# Patient Record
Sex: Female | Born: 1935 | Race: White | Hispanic: No | Smoking: Never smoker
Health system: Southern US, Community
[De-identification: ages and names within clinical notes are randomized; demographics above are authoritative.]

## PROBLEM LIST (undated history)

## (undated) DIAGNOSIS — R262 Difficulty in walking, not elsewhere classified: Secondary | ICD-10-CM

## (undated) DIAGNOSIS — G5600 Carpal tunnel syndrome, unspecified upper limb: Secondary | ICD-10-CM

## (undated) DIAGNOSIS — D649 Anemia, unspecified: Secondary | ICD-10-CM

## (undated) DIAGNOSIS — H9319 Tinnitus, unspecified ear: Secondary | ICD-10-CM

## (undated) DIAGNOSIS — R7989 Other specified abnormal findings of blood chemistry: Secondary | ICD-10-CM

## (undated) DIAGNOSIS — R269 Unspecified abnormalities of gait and mobility: Secondary | ICD-10-CM

## (undated) DIAGNOSIS — N1832 Chronic kidney disease, stage 3b: Secondary | ICD-10-CM

## (undated) DIAGNOSIS — I10 Essential (primary) hypertension: Secondary | ICD-10-CM

## (undated) HISTORY — DX: Difficulty in walking, not elsewhere classified: R26.2

## (undated) HISTORY — DX: Chronic kidney disease, stage 3b: N18.32

## (undated) HISTORY — PX: TOTAL ABDOMINAL HYSTERECTOMY W/ BILATERAL SALPINGOOPHORECTOMY: SHX83

## (undated) HISTORY — PX: APPENDECTOMY: SHX54

## (undated) HISTORY — PX: CORNEAL TRANSPLANT: SHX108

## (undated) HISTORY — DX: Other specified abnormal findings of blood chemistry: R79.89

## (undated) HISTORY — DX: Unspecified abnormalities of gait and mobility: R26.9

## (undated) HISTORY — DX: Carpal tunnel syndrome, unspecified upper limb: G56.00

## (undated) HISTORY — DX: Anemia, unspecified: D64.9

## (undated) HISTORY — PX: TUBAL LIGATION: SHX77

## (undated) HISTORY — PX: BACK SURGERY: SHX140

## (undated) HISTORY — PX: CATARACT EXTRACTION, BILATERAL: SHX1313

## (undated) HISTORY — DX: Tinnitus, unspecified ear: H93.19

---

## 2002-01-07 ENCOUNTER — Ambulatory Visit (HOSPITAL_COMMUNITY): Admission: RE | Admit: 2002-01-07 | Discharge: 2002-01-07 | Payer: Self-pay | Admitting: Ophthalmology

## 2008-08-18 ENCOUNTER — Ambulatory Visit: Payer: Self-pay | Admitting: Family Medicine

## 2009-09-21 ENCOUNTER — Ambulatory Visit: Payer: Self-pay | Admitting: Family Medicine

## 2010-05-22 ENCOUNTER — Ambulatory Visit: Payer: Self-pay | Admitting: Otolaryngology

## 2010-12-04 ENCOUNTER — Ambulatory Visit: Payer: Self-pay | Admitting: Family Medicine

## 2011-12-18 ENCOUNTER — Ambulatory Visit: Payer: Self-pay | Admitting: Family Medicine

## 2011-12-24 ENCOUNTER — Ambulatory Visit: Payer: Self-pay | Admitting: Gastroenterology

## 2016-10-29 DIAGNOSIS — I10 Essential (primary) hypertension: Secondary | ICD-10-CM | POA: Insufficient documentation

## 2020-04-11 ENCOUNTER — Other Ambulatory Visit: Payer: Self-pay | Admitting: Unknown Physician Specialty

## 2020-04-11 DIAGNOSIS — H9312 Tinnitus, left ear: Secondary | ICD-10-CM

## 2020-04-13 ENCOUNTER — Other Ambulatory Visit: Payer: Self-pay

## 2020-04-13 ENCOUNTER — Ambulatory Visit: Payer: Medicare PPO | Admitting: Nurse Practitioner

## 2020-04-13 VITALS — BP 150/90 | HR 66 | Temp 97.5°F | Ht 61.0 in | Wt 128.0 lb

## 2020-04-13 DIAGNOSIS — I10 Essential (primary) hypertension: Secondary | ICD-10-CM | POA: Diagnosis not present

## 2020-04-13 NOTE — Patient Instructions (Signed)
To call Janci ([place] NURSE) on Monday 5/24 and schedule a blood pressure check- make sure to bring your blood pressure cuff in with you   [facility] [place] NURSE phone number is [phone]- please call and make an appointment

## 2020-04-13 NOTE — Progress Notes (Signed)
Careteam: Patient Care Team: Dorothey Baseman, MD as PCP - General (Family Medicine)  Advanced Directive information    No Known Allergies  Chief Complaint  Patient presents with  . Acute Visit    Blood Pressure check     HPI: Patient is a 86 y.o. female seen in today at the [facility] for blood pressure check. Pt with hx of HTN and reports she checks her blood pressure at home but did not trust her readings.  Took medication around 5 am this morning Generally blood pressure at home is less than 140 but can not recall exact readings.  No headaches, blurred vision, shortness of breath, dizziness, LE edema Reports overall she feels well.   Review of Systems:  Review of Systems  Constitutional: Negative for chills, fever and weight loss.  HENT: Negative for tinnitus.   Respiratory: Negative for cough, sputum production and shortness of breath.   Cardiovascular: Negative for chest pain, palpitations and leg swelling.  Gastrointestinal: Negative for abdominal pain, constipation, diarrhea and heartburn.  Skin: Negative.   Neurological: Negative for dizziness and headaches.  Psychiatric/Behavioral: Negative for depression and memory loss. The patient does not have insomnia.     No past medical history on file.  Social History:   has no history on file for tobacco, alcohol, and drug.  No family history on file.  Medications: Patient's Medications  New Prescriptions   No medications on file  Previous Medications   LISINOPRIL-HYDROCHLOROTHIAZIDE (ZESTORETIC) 20-12.5 MG TABLET    Take by mouth.   METOPROLOL SUCCINATE (TOPROL-XL) 25 MG 24 HR TABLET    TAKE 1 TABLET(25 MG) BY MOUTH EVERY DAY  Modified Medications   No medications on file  Discontinued Medications   No medications on file    Physical Exam:  Vitals:   04/13/20 0917  BP: (!) 150/90  Pulse: 66  Temp: (!) 97.5 F (36.4 C)  TempSrc: Temporal  SpO2: 98%  Weight: 128 lb (58.1 kg)  Height: 5'  1" (1.549 m)   Body mass index is 24.19 kg/m. Wt Readings from Last 3 Encounters:  04/13/20 128 lb (58.1 kg)    Physical Exam Constitutional:      General: She is not in acute distress.    Appearance: She is well-developed.  HENT:     Head: Normocephalic and atraumatic.  Eyes:     Conjunctiva/sclera: Conjunctivae normal.     Pupils: Pupils are equal, round, and reactive to light.  Cardiovascular:     Rate and Rhythm: Normal rate and regular rhythm.     Heart sounds: Normal heart sounds.  Pulmonary:     Effort: Pulmonary effort is normal.     Breath sounds: Normal breath sounds.  Skin:    General: Skin is warm and dry.  Neurological:     Mental Status: She is alert and oriented to person, place, and time.  Psychiatric:        Behavior: Behavior normal.     Labs reviewed: Basic Metabolic Panel: No results for input(s): NA, K, CL, CO2, GLUCOSE, BUN, CREATININE, CALCIUM, MG, PHOS, TSH in the last 8760 hours. Liver Function Tests: No results for input(s): AST, ALT, ALKPHOS, BILITOT, PROT, ALBUMIN in the last 8760 hours. No results for input(s): LIPASE, AMYLASE in the last 8760 hours. No results for input(s): AMMONIA in the last 8760 hours. CBC: No results for input(s): WBC, NEUTROABS, HGB, HCT, MCV, PLT in the last 8760 hours. Lipid Panel: No results for input(s): CHOL,  HDL, LDLCALC, TRIG, CHOLHDL, LDLDIRECT in the last 8760 hours. TSH: No results for input(s): TSH in the last 8760 hours. A1C: No results found for: HGBA1C   Assessment/Plan 1. Essential hypertension Blood pressure confirmed on recheck. She states the are generally better at home. Will have her schedule appt with [place] nurse for recheck and to bring her home bp cuff in to correlate. Continues on lisinopril-hctz with metoprolol succinate 25 mg daily. Low sodium diet encouraged.    Carlos American. [place], [facility] [phone]

## 2020-04-26 ENCOUNTER — Other Ambulatory Visit: Payer: Self-pay

## 2020-04-26 ENCOUNTER — Ambulatory Visit
Admission: RE | Admit: 2020-04-26 | Discharge: 2020-04-26 | Disposition: A | Discharge status: Home / Self Care | Payer: Medicare PPO | Source: Ambulatory Visit | Attending: Unknown Physician Specialty | Admitting: Unknown Physician Specialty

## 2020-04-26 DIAGNOSIS — H9312 Tinnitus, left ear: Secondary | ICD-10-CM | POA: Diagnosis present

## 2020-04-26 MED ORDER — GADOBUTROL 1 MMOL/ML IV SOLN
5.0000 mL | Freq: Once | INTRAVENOUS | Status: AC | PRN
  Administered 2020-04-26: 5 mL via INTRAVENOUS

## 2020-08-23 DIAGNOSIS — R29898 Other symptoms and signs involving the musculoskeletal system: Secondary | ICD-10-CM | POA: Insufficient documentation

## 2020-08-23 DIAGNOSIS — R440 Auditory hallucinations: Secondary | ICD-10-CM | POA: Insufficient documentation

## 2020-12-04 DIAGNOSIS — R2689 Other abnormalities of gait and mobility: Secondary | ICD-10-CM | POA: Insufficient documentation

## 2020-12-04 DIAGNOSIS — R262 Difficulty in walking, not elsewhere classified: Secondary | ICD-10-CM | POA: Insufficient documentation

## 2021-04-16 ENCOUNTER — Encounter: Payer: Self-pay | Admitting: Emergency Medicine

## 2021-04-16 ENCOUNTER — Emergency Department: Payer: Medicare PPO

## 2021-04-16 ENCOUNTER — Emergency Department
Admission: EM | Admit: 2021-04-16 | Discharge: 2021-04-16 | Disposition: A | Discharge status: Home / Self Care | Payer: Medicare PPO | Attending: Emergency Medicine | Admitting: Emergency Medicine

## 2021-04-16 ENCOUNTER — Other Ambulatory Visit: Payer: Self-pay

## 2021-04-16 DIAGNOSIS — I10 Essential (primary) hypertension: Secondary | ICD-10-CM | POA: Diagnosis not present

## 2021-04-16 DIAGNOSIS — R110 Nausea: Secondary | ICD-10-CM | POA: Insufficient documentation

## 2021-04-16 DIAGNOSIS — Z79899 Other long term (current) drug therapy: Secondary | ICD-10-CM | POA: Diagnosis not present

## 2021-04-16 DIAGNOSIS — H8110 Benign paroxysmal vertigo, unspecified ear: Secondary | ICD-10-CM

## 2021-04-16 DIAGNOSIS — R42 Dizziness and giddiness: Secondary | ICD-10-CM | POA: Diagnosis present

## 2021-04-16 HISTORY — DX: Essential (primary) hypertension: I10

## 2021-04-16 LAB — COMPREHENSIVE METABOLIC PANEL
ALT: 33 U/L (ref 0–44)
AST: 36 U/L (ref 15–41)
Albumin: 4.2 g/dL (ref 3.5–5.0)
Alkaline Phosphatase: 57 U/L (ref 38–126)
Anion gap: 12 (ref 5–15)
BUN: 38 mg/dL — ABNORMAL HIGH (ref 8–23)
CO2: 26 mmol/L (ref 22–32)
Calcium: 9.3 mg/dL (ref 8.9–10.3)
Chloride: 98 mmol/L (ref 98–111)
Creatinine, Ser: 1.17 mg/dL — ABNORMAL HIGH (ref 0.44–1.00)
GFR, Estimated: 45 mL/min — ABNORMAL LOW (ref >60)
Glucose, Bld: 102 mg/dL — ABNORMAL HIGH (ref 70–99)
Potassium: 3.8 mmol/L (ref 3.5–5.1)
Sodium: 136 mmol/L (ref 135–145)
Total Bilirubin: 0.9 mg/dL (ref 0.3–1.2)
Total Protein: 7 g/dL (ref 6.5–8.1)

## 2021-04-16 LAB — PROTIME-INR
INR: 0.9 (ref 0.8–1.2)
Prothrombin Time: 12.6 seconds (ref 11.4–15.2)

## 2021-04-16 LAB — DIFFERENTIAL
Abs Immature Granulocytes: 0.02 10*3/uL (ref 0.00–0.07)
Basophils Absolute: 0 10*3/uL (ref 0.0–0.1)
Basophils Relative: 1 %
Eosinophils Absolute: 0.2 10*3/uL (ref 0.0–0.5)
Eosinophils Relative: 4 %
Immature Granulocytes: 0 %
Lymphocytes Relative: 24 %
Lymphs Abs: 1.2 10*3/uL (ref 0.7–4.0)
Monocytes Absolute: 0.6 10*3/uL (ref 0.1–1.0)
Monocytes Relative: 11 %
Neutro Abs: 3.1 10*3/uL (ref 1.7–7.7)
Neutrophils Relative %: 60 %

## 2021-04-16 LAB — APTT: aPTT: 27 seconds (ref 24–36)

## 2021-04-16 LAB — CBC
HCT: 35.6 % — ABNORMAL LOW (ref 36.0–46.0)
Hemoglobin: 12.5 g/dL (ref 12.0–15.0)
MCH: 34.6 pg — ABNORMAL HIGH (ref 26.0–34.0)
MCHC: 35.1 g/dL (ref 30.0–36.0)
MCV: 98.6 fL (ref 80.0–100.0)
Platelets: 238 10*3/uL (ref 150–400)
RBC: 3.61 MIL/uL — ABNORMAL LOW (ref 3.87–5.11)
RDW: 12.3 % (ref 11.5–15.5)
WBC: 5.1 10*3/uL (ref 4.0–10.5)
nRBC: 0 % (ref 0.0–0.2)

## 2021-04-16 MED ORDER — MECLIZINE HCL 25 MG PO TABS
25.0000 mg | ORAL_TABLET | Freq: Once | ORAL | Status: AC
  Administered 2021-04-16: 25 mg via ORAL
  Filled 2021-04-16: qty 1

## 2021-04-16 MED ORDER — MECLIZINE HCL 25 MG PO TABS: 25.0000 mg | ORAL_TABLET | Freq: Three times a day (TID) | ORAL | 0 refills | Status: DC | PRN

## 2021-04-16 NOTE — ED Triage Notes (Signed)
To ED via POV with c/o severe dizziness, pt states is unable to stand due to dizziness. Pt states hx of balance issues at baseline. Pt states LKW 5/22 at 1030pm, awoke with symptoms this morning at 0530. Pt denies further symptoms, A&O x4 in triage.

## 2021-04-16 NOTE — ED Provider Notes (Signed)
[facility] Emergency Department Provider Note   ____________________________________________   Event Date/Time   First MD Initiated Contact with Patient 04/16/21 1502     (approximate)  I have reviewed the triage vital signs and the nursing notes.   HISTORY  Chief Complaint Dizziness    HPI Brittany Bryant is a 87 y.o. female with the below stated past medical history who presents for vertigo that began at 0530 this morning with last known well time yesterday at 2230.  Patient states that she noticed when she tried to get up from bed that she had the sensation of the "room spinning around me" with associated nausea without vomiting.  Patient denies any history of similar symptoms in the past.  Patient states that the symptoms have improved throughout the day.  Patient denies any other exacerbating or relieving factors.  Patient currently denies any vision changes, tinnitus, difficulty speaking, facial droop, sore throat, chest pain, shortness of breath, abdominal pain, nausea/vomiting/diarrhea, dysuria, or weakness/numbness/paresthesias in any extremity         Past Medical History:  Diagnosis Date  . Hypertension     There are no problems to display for this patient.   History reviewed. No pertinent surgical history.  Prior to Admission medications   Medication Sig Start Date End Date Taking? Authorizing Provider  lisinopril-hydrochlorothiazide (ZESTORETIC) 20-12.5 MG tablet Take by mouth. 02/28/20   [provider]  metoprolol succinate (TOPROL-XL) 25 MG 24 hr tablet TAKE 1 TABLET(25 MG) BY MOUTH EVERY DAY 01/18/20   [provider]    Allergies Patient has no known allergies.  History reviewed. No pertinent family history.  Social History Social History   Tobacco Use  . Smoking status: Never Smoker  . Smokeless tobacco: Never Used  Substance Use Topics  . Alcohol use: Not Currently  . Drug use: Not Currently     Review of Systems Constitutional: No fever/chills Eyes: No visual changes. ENT: No sore throat. Cardiovascular: Denies chest pain. Respiratory: Denies shortness of breath. Gastrointestinal: No abdominal pain.  No nausea, no vomiting.  No diarrhea. Genitourinary: Negative for dysuria. Musculoskeletal: Negative for acute arthralgias Skin: Negative for rash. Neurological: Negative for headaches, weakness/numbness/paresthesias in any extremity.  Endorses vertigo Psychiatric: Negative for suicidal ideation/homicidal ideation   ____________________________________________   PHYSICAL EXAM:  VITAL SIGNS: ED Triage Vitals  Enc Vitals Group     BP 04/16/21 1205 (!) 153/79     Pulse Rate 04/16/21 1205 65     Resp 04/16/21 1205 20     Temp 04/16/21 1205 98.4 F (36.9 C)     Temp Source 04/16/21 1205 Oral     SpO2 04/16/21 1205 94 %     Weight 04/16/21 1211 134 lb (60.8 kg)     Height 04/16/21 1211 5\' 1"  (1.549 m)     Head Circumference --      Peak Flow --      Pain Score 04/16/21 1210 0     Pain Loc --      Pain Edu? --      Excl. in GC? --    Constitutional: Alert and oriented. Well appearing and in no acute distress. Eyes: Conjunctivae are normal. PERRL.  No nystagmus Head: Atraumatic. Nose: No congestion/rhinnorhea. Mouth/Throat: Mucous membranes are moist. Neck: No stridor Cardiovascular: Grossly normal heart sounds.  Good peripheral circulation. Respiratory: Normal respiratory effort.  No retractions. Gastrointestinal: Soft and nontender. No distention. Musculoskeletal: No obvious deformities Neurologic:  Normal speech and language.  No gross focal neurologic deficits are appreciated. Skin:  Skin is warm and dry. No rash noted. Psychiatric: Mood and affect are normal. Speech and behavior are normal.  ____________________________________________   LABS (all labs ordered are listed, but only abnormal results are displayed)  Labs Reviewed  CBC - Abnormal; Notable  for the following components:      Result Value   RBC 3.61 (*)    HCT 35.6 (*)    MCH 34.6 (*)    All other components within normal limits  COMPREHENSIVE METABOLIC PANEL - Abnormal; Notable for the following components:   Glucose, Bld 102 (*)    BUN 38 (*)    Creatinine, Ser 1.17 (*)    GFR, Estimated 45 (*)    All other components within normal limits  PROTIME-INR  APTT  DIFFERENTIAL  CBG MONITORING, ED   ____________________________________________  EKG  ED ECG REPORT I, Merwyn Katos, the attending physician, personally viewed and interpreted this ECG.  Date: 04/16/2021 EKG Time: 1206 Rate: 66 Rhythm: normal sinus rhythm QRS Axis: normal Intervals: normal ST/T Wave abnormalities: normal Narrative Interpretation: no evidence of acute ischemia  ____________________________________________  RADIOLOGY  ED MD interpretation: CT of the head without contrast shows no evidence of acute abnormalities including no intracerebral hemorrhage, obvious masses, or significant edema  Official radiology report(s): CT HEAD WO CONTRAST  Result Date: 04/16/2021 CLINICAL DATA:  Severe dizziness EXAM: CT HEAD WITHOUT CONTRAST TECHNIQUE: Contiguous axial images were obtained from the base of the skull through the vertex without intravenous contrast. COMPARISON:  MRI head 05/22/2020 FINDINGS: Brain: No evidence of acute infarction, hemorrhage, hydrocephalus, extra-axial collection or mass lesion/mass effect. Incidental note made of normal anatomic variation of cavum septum pellucidum and vergae. Diffuse cortical atrophy. Vascular: No hyperdense vessel or unexpected calcification. Skull: Normal. Negative for fracture or focal lesion. Sinuses/Orbits: No acute finding. Other: None. IMPRESSION: No acute intracranial abnormality. Electronically Signed   By: Acquanetta Belling M.D.   On: 04/16/2021 12:50    ____________________________________________   PROCEDURES  Procedure(s) performed (including  Critical Care):  .1-3 Lead EKG Interpretation Performed by: Merwyn Katos, MD Authorized by: Merwyn Katos, MD     Interpretation: normal     ECG rate:  60   ECG rate assessment: normal     Rhythm: sinus rhythm     Ectopy: none     Conduction: normal       ____________________________________________   INITIAL IMPRESSION / ASSESSMENT AND PLAN / ED COURSE  As part of my medical decision making, I reviewed the following data within the electronic MEDICAL RECORD NUMBER Nursing notes reviewed and incorporated, Labs reviewed, EKG interpreted, Old chart reviewed, Radiograph reviewed and Notes from prior ED visits reviewed and incorporated      Based on History, Exam, and Findings, presentation not consistent with syncope, seizure, stroke, meningitis, symptomatic anemia (gastrointestinal bleed), Increased ICP (cerebral tumor/mass), ICH. Additionally, I have a low suspicion for AOM, labyrinthitis, or other infectious process. Tx: meclizine Reassessment: Prior to discharge symptoms controlled, patient well appearing. Disposition:  Discharge. Strict return precautions discussed w/ full understanding. Advise follow up with primary care provider within 24-48 hours.        ____________________________________________   FINAL CLINICAL IMPRESSION(S) / ED DIAGNOSES  Final diagnoses:  Benign paroxysmal positional vertigo, unspecified laterality     ED Discharge Orders    None       Note:  This document was prepared using Dragon voice recognition software and may include  unintentional dictation errors.   Merwyn Katos, MD 04/16/21 [id number]

## 2021-06-13 DIAGNOSIS — G5601 Carpal tunnel syndrome, right upper limb: Secondary | ICD-10-CM | POA: Insufficient documentation

## 2021-07-03 ENCOUNTER — Encounter: Payer: Self-pay | Admitting: Internal Medicine

## 2021-07-03 DIAGNOSIS — I10 Essential (primary) hypertension: Secondary | ICD-10-CM | POA: Insufficient documentation

## 2021-07-03 DIAGNOSIS — N1832 Chronic kidney disease, stage 3b: Secondary | ICD-10-CM | POA: Insufficient documentation

## 2021-07-03 DIAGNOSIS — R269 Unspecified abnormalities of gait and mobility: Secondary | ICD-10-CM | POA: Insufficient documentation

## 2021-07-05 ENCOUNTER — Other Ambulatory Visit: Payer: Self-pay

## 2021-07-05 ENCOUNTER — Encounter: Payer: Self-pay | Admitting: Internal Medicine

## 2021-07-05 ENCOUNTER — Ambulatory Visit: Payer: Medicare PPO | Admitting: Internal Medicine

## 2021-07-05 VITALS — BP 164/81 | HR 62 | Temp 98.8°F | Resp 18 | Wt 120.0 lb

## 2021-07-05 DIAGNOSIS — R269 Unspecified abnormalities of gait and mobility: Secondary | ICD-10-CM

## 2021-07-05 DIAGNOSIS — I10 Essential (primary) hypertension: Secondary | ICD-10-CM

## 2021-07-05 DIAGNOSIS — N1832 Chronic kidney disease, stage 3b: Secondary | ICD-10-CM | POA: Diagnosis not present

## 2021-07-05 DIAGNOSIS — J069 Acute upper respiratory infection, unspecified: Secondary | ICD-10-CM | POA: Insufficient documentation

## 2021-07-05 DIAGNOSIS — R440 Auditory hallucinations: Secondary | ICD-10-CM

## 2021-07-05 NOTE — Assessment & Plan Note (Signed)
BP Readings from Last 3 Encounters:  07/05/21 (!) 164/81  04/16/21 (!) 157/71  04/13/20 (!) 150/90   BP up some now with illness Will just monitor here---lisinopril/HCTZ/metoprolol

## 2021-07-05 NOTE — Assessment & Plan Note (Signed)
Last GFR 45 Is on lisinopril Will monitor labs at least yearly

## 2021-07-05 NOTE — Assessment & Plan Note (Signed)
Has had neurologic evaluation Needs assistance with bathing and dressing

## 2021-07-05 NOTE — Progress Notes (Signed)
Subjective:    Patient ID: Brittany Bryant, female    DOB: 09-30-1931, 87 y.o.   MRN: [id number]  HPI Visit to establish care in assisted living apartment Reviewed prior records Reviewed status with Magda Paganini RN  [facility] for 14 years Decided to move to [place] due to loneliness and trouble with groceries, etc  Has "a bad cold" for 5 days Sore throat is some better now--but still congestion, runny nose Some fever--but not in past few days (T max 100) Some cough---dry No SOB Multiple COVID tests negative  Long standing HTN Takes metoprolol and lisinopril/HCTZ bid No heart trouble  Known CKD--recent GFR 45 No nephrologist  Has seen neurologist due to gait problems Uses walker She gets auditory hallucinations also--hears music. Just started on nighttime risperidone--seems to be helping  Needs assistance with dressing and bathing  Current Outpatient Medications on File Prior to Visit  Medication Sig Dispense Refill   lisinopril-hydrochlorothiazide (ZESTORETIC) 20-12.5 MG tablet Take 1 tablet by mouth in the morning and at bedtime.     metoprolol succinate (TOPROL-XL) 25 MG 24 hr tablet Take 1 tablet by mouth in the morning and at bedtime.     risperiDONE (RISPERDAL) 0.25 MG tablet Take 0.25 mg by mouth at bedtime.     No current facility-administered medications on file prior to visit.    No Known Allergies  Past Medical History:  Diagnosis Date   Gait disturbance    Hypertension    Low serum vitamin D    Stage 3b chronic kidney disease (HCC)     Past Surgical History:  Procedure Laterality Date   APPENDECTOMY     CATARACT EXTRACTION, BILATERAL     CORNEAL TRANSPLANT     TOTAL ABDOMINAL HYSTERECTOMY W/ BILATERAL SALPINGOOPHORECTOMY     TUBAL LIGATION      Family History  Problem Relation Age of Onset   Arthritis Father    COPD Father     Social History   Socioeconomic History   Marital status: Widowed    Spouse name: Not on file   Number of children:  Not on file   Years of education: Not on file   Highest education level: Not on file  Occupational History   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  Tobacco Use   Smoking status: Never   Smokeless tobacco: Never  Substance and Sexual Activity   Alcohol use: Yes    Comment: rare wine   Drug use: Not Currently   Sexual activity: Not on file  Other Topics Concern   Not on file  Social History Narrative   Not on file   Social Determinants of Health   Financial Resource Strain: Not on file  Food Insecurity: Not on file  Transportation Needs: Not on file  Physical Activity: Not on file  Stress: Not on file  Social Connections: Not on file  Intimate Partner Violence: Not on file   Review of Systems  Constitutional:  Negative for fatigue and unexpected weight change.  HENT:  Positive for hearing loss.        Keeps up with dentist Hearing aides  Eyes:  Negative for visual disturbance.  Respiratory:  Positive for cough. Negative for shortness of breath and wheezing.   Cardiovascular:  Negative for chest pain, palpitations and leg swelling.  Gastrointestinal:        Some recent constipation---MOM helps No heartburn  Endocrine: Negative for polydipsia and polyuria.  Genitourinary:  Negative for dysuria and hematuria.  Musculoskeletal:  Negative for arthralgias, back pain and joint swelling.  Skin:  Negative for rash.       No suspicious skin lesions  Allergic/Immunologic: Negative for environmental allergies and immunocompromised state.  Neurological:  Negative for syncope and headaches.       Had ER visit for vertigo in May--resolved  Psychiatric/Behavioral:  Negative for dysphoric mood and sleep disturbance. The patient is not nervous/anxious.       Objective:   Physical Exam Constitutional:      Appearance: Normal appearance.  HENT:     Mouth/Throat:     Pharynx: No oropharyngeal exudate or posterior oropharyngeal erythema.  Eyes:      Conjunctiva/sclera: Conjunctivae normal.  Cardiovascular:     Rate and Rhythm: Normal rate and regular rhythm.     Heart sounds: No murmur heard.   No gallop.     Comments: Feet warm but no palpable pulses Pulmonary:     Effort: Pulmonary effort is normal.     Breath sounds: Normal breath sounds. No wheezing or rales.  Abdominal:     Palpations: Abdomen is soft.     Tenderness: There is no abdominal tenderness.  Musculoskeletal:     Cervical back: Neck supple.     Right lower leg: No edema.     Left lower leg: No edema.  Lymphadenopathy:     Cervical: No cervical adenopathy.  Skin:    Findings: No rash.  Neurological:     General: No focal deficit present.     Mental Status: She is alert and oriented to person, place, and time.  Psychiatric:        Mood and Affect: Mood normal.        Behavior: Behavior normal.           Assessment & Plan:

## 2021-07-05 NOTE — Assessment & Plan Note (Signed)
No dementia--will monitor for symptoms of Lewy body May be some better with the risperidal Now in [place] so can be monitored better

## 2021-07-05 NOTE — Assessment & Plan Note (Signed)
Fairly mild symptoms Now improving Will continue supportive care---she is staying in her room till symptoms resolve Had second COVID booster

## 2021-10-08 IMAGING — CT CT HEAD W/O CM
3 series · 16 of 47 positions shown, 19 images · non-contrast
Comparison: MRI head 05/22/2020

CLINICAL DATA: Severe dizziness

EXAM:
CT HEAD WITHOUT CONTRAST
TECHNIQUE: Contiguous axial images were obtained from the base of the skull
through the vertex without intravenous contrast.

[Series 3: head wo · axial · 0.45mm/px · z∈[+313,+443]mm · 10 of 32 slices shown, 13 images]
[im 3/32  brain]
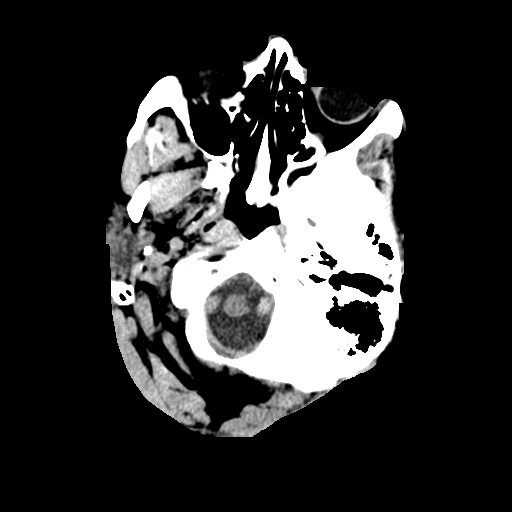
[im 3/32  bone]
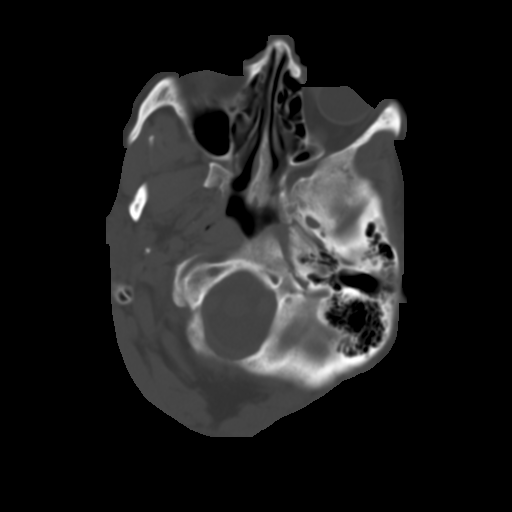
[im 6/32  brain]
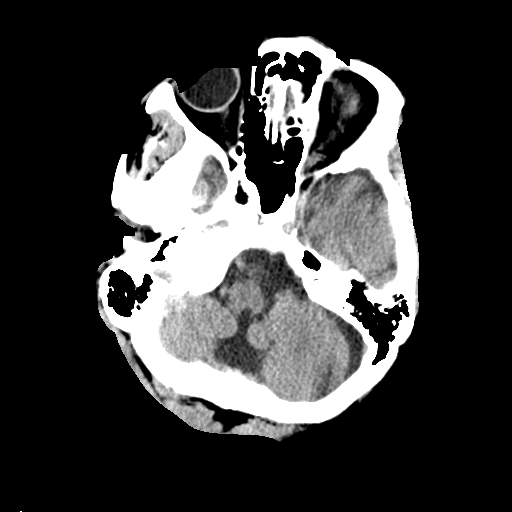
[im 9/32  brain]
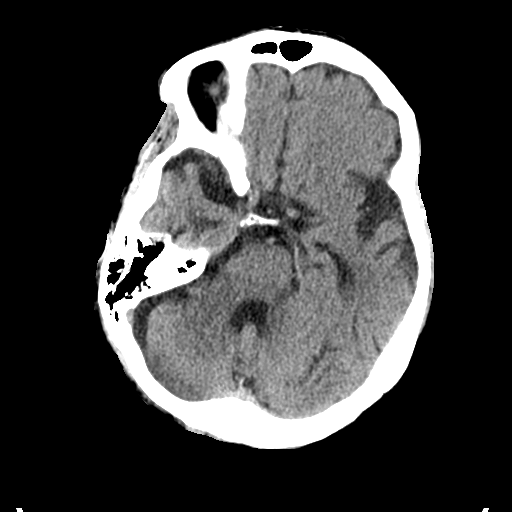
[im 11/32  brain]
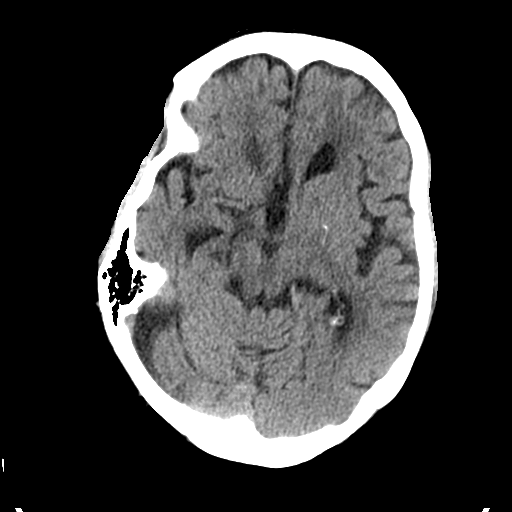
[im 14/32  brain]
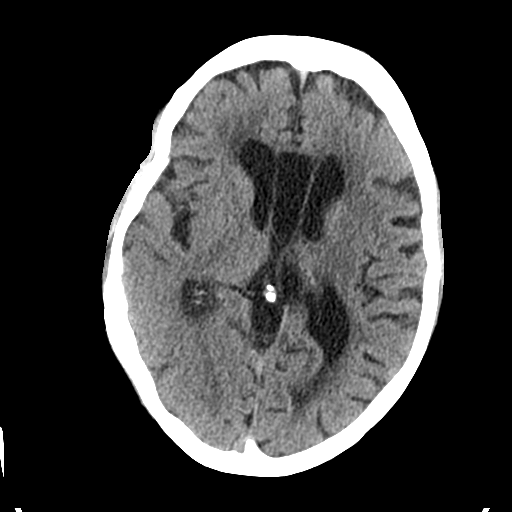
[im 14/32  bone]
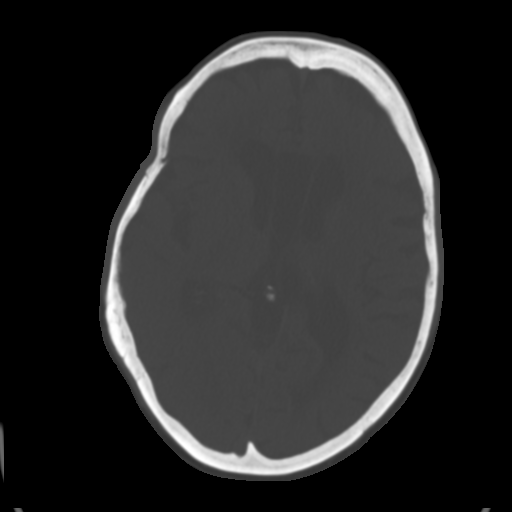
[im 18/32  brain]
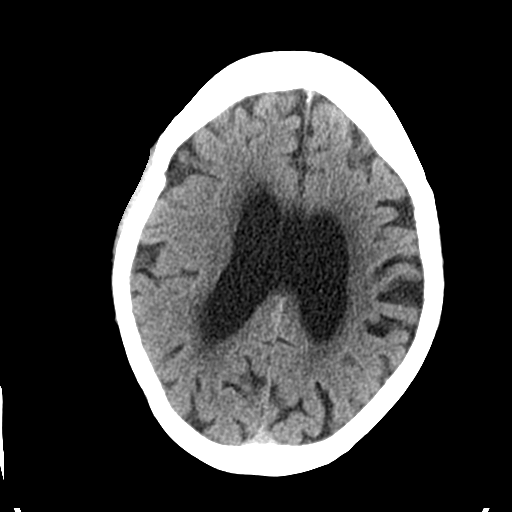
[im 21/32  brain]
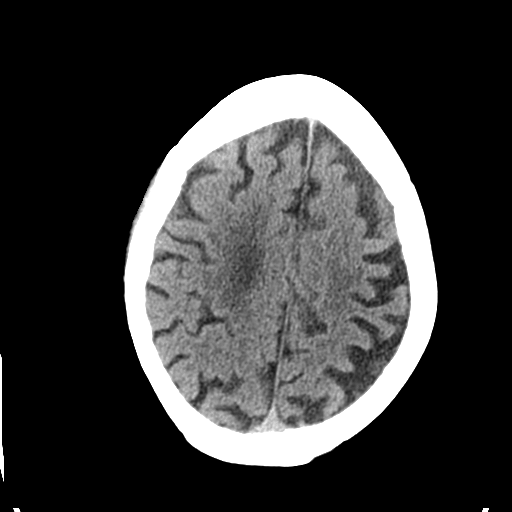
[im 24/32  brain]
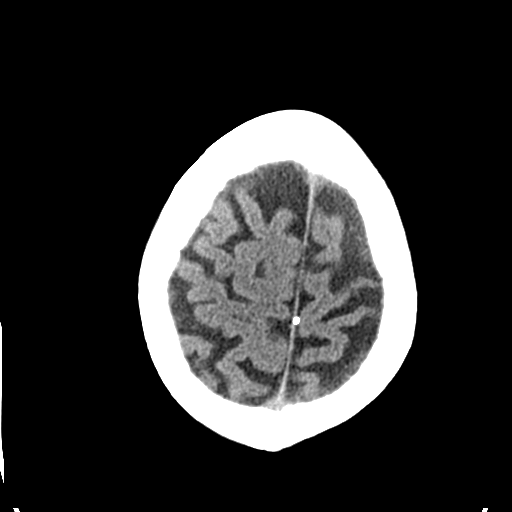
[im 26/32  brain]
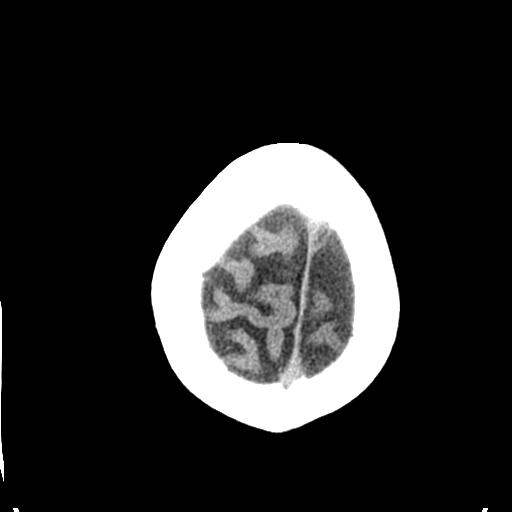
[im 26/32  bone]
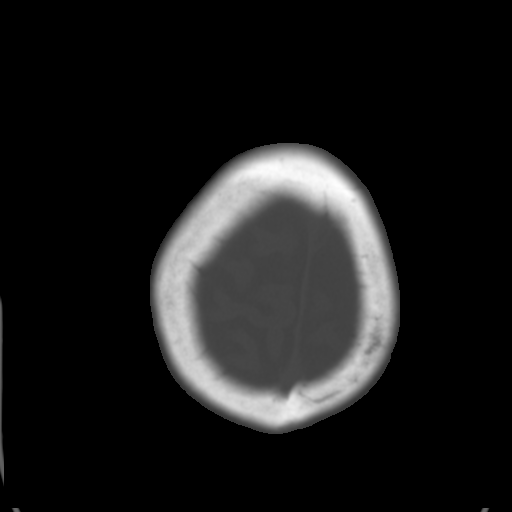
[im 29/32  brain]
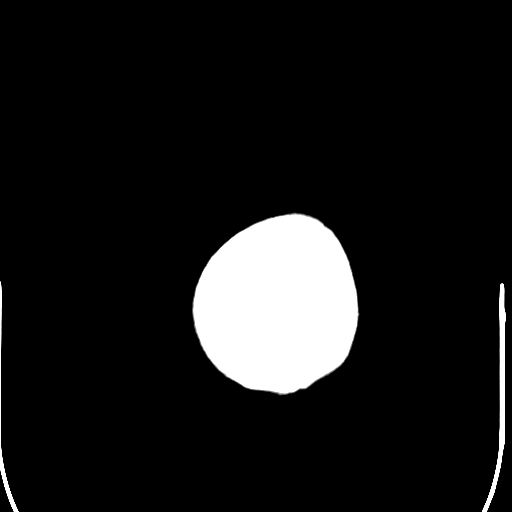

[Series 4: coronal soft tissue · coronal · 0.33mm/px · 3 of 69 slices shown]
[im 23/69  brain]
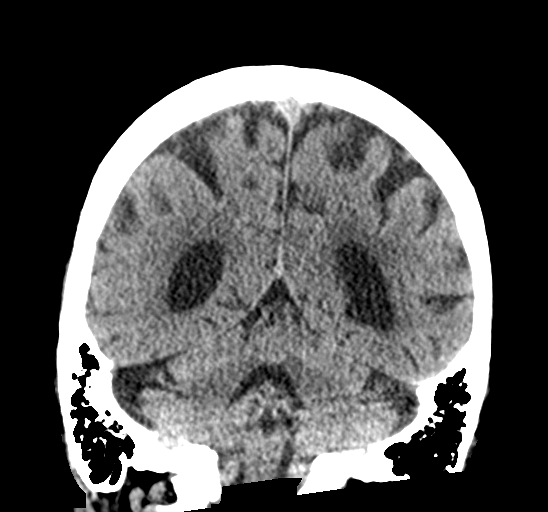
[im 31/69  brain]
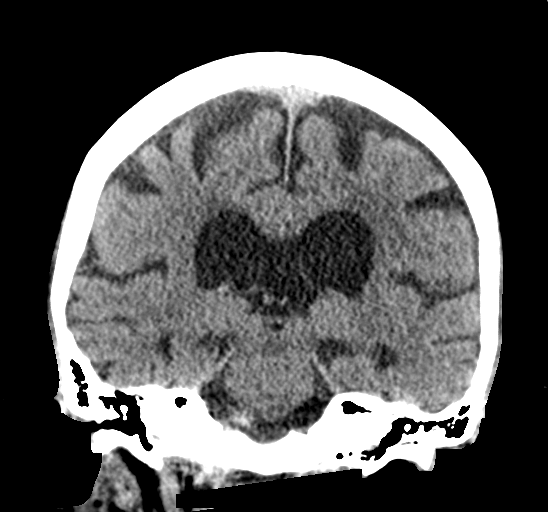
[im 38/69  brain]
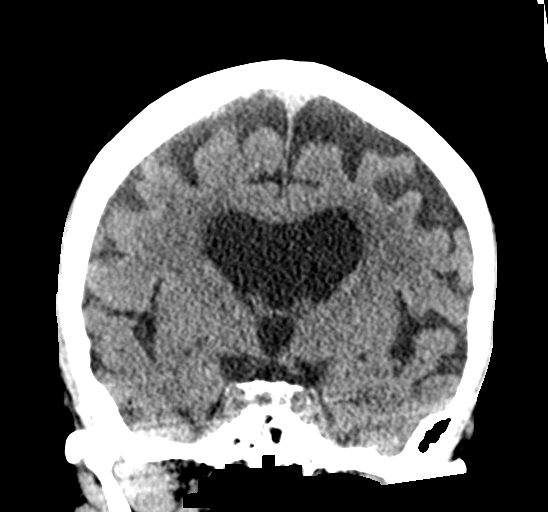

[Series 5: sagittal soft tissue · sagittal · 0.33mm/px · 3 of 61 slices shown]
[im 25/61  brain]
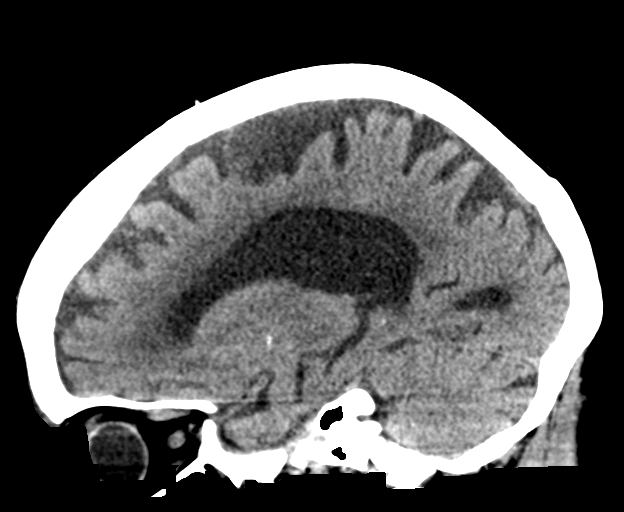
[im 31/61  brain]
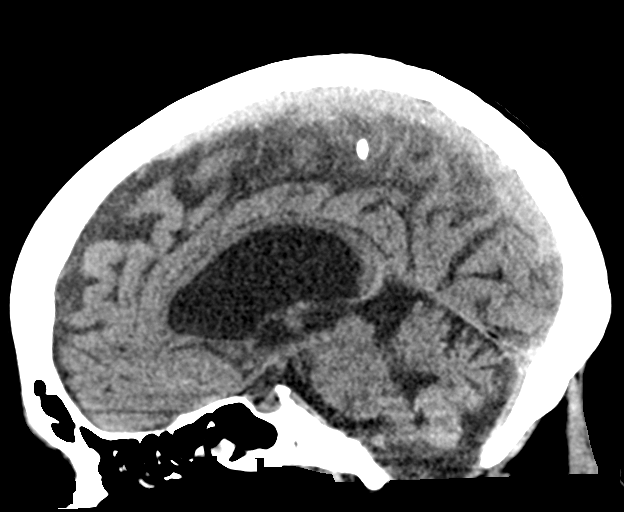
[im 37/61  brain]
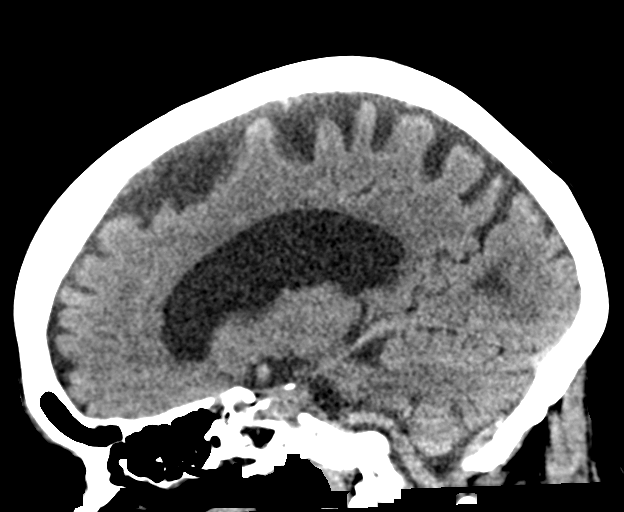

[16 of 47 positions shown; findings below may reference images not displayed]

FINDINGS: Brain: No evidence of acute infarction, hemorrhage, hydrocephalus,
extra-axial collection or mass lesion/mass effect. Incidental note
made of normal anatomic variation of cavum septum pellucidum and
vergae. Diffuse cortical atrophy.

Vascular: No hyperdense vessel or unexpected calcification.

Skull: Normal. Negative for fracture or focal lesion.

Sinuses/Orbits: No acute finding.

Other: None.
IMPRESSION: No acute intracranial abnormality.

## 2021-10-12 ENCOUNTER — Non-Acute Institutional Stay: Payer: Medicare PPO | Admitting: Internal Medicine

## 2021-10-12 DIAGNOSIS — N1832 Chronic kidney disease, stage 3b: Secondary | ICD-10-CM | POA: Diagnosis not present

## 2021-10-12 DIAGNOSIS — I10 Essential (primary) hypertension: Secondary | ICD-10-CM

## 2021-10-12 DIAGNOSIS — R440 Auditory hallucinations: Secondary | ICD-10-CM

## 2021-10-15 ENCOUNTER — Encounter: Payer: Self-pay | Admitting: Internal Medicine

## 2021-10-15 LAB — CBC: RBC: 3.28 — AB (ref 3.87–5.11)

## 2021-10-15 LAB — BASIC METABOLIC PANEL
BUN: 1 — AB (ref 4–21)
Potassium: 3.8 mEq/L (ref 3.5–5.1)
Sodium: 131 — AB (ref 137–147)

## 2021-10-15 LAB — CBC AND DIFFERENTIAL
HCT: 32 — AB (ref 36–46)
Hemoglobin: 10.9 — AB (ref 12.0–16.0)

## 2021-10-25 ENCOUNTER — Encounter: Payer: Self-pay | Admitting: Internal Medicine

## 2021-10-25 NOTE — Assessment & Plan Note (Signed)
Continue Risperdal.

## 2021-10-25 NOTE — Progress Notes (Signed)
Subjective:    Patient ID: Brittany Bryant, female    DOB: Sep 11, 1931, 87 y.o.   MRN: [id number]  HPI  Resident seen in [address] Reviewed with RN, no new concerns Resident reports she sleeps well. Her appetite is good, weight 129 lbs (up 9 lbs but stable from baseline). She is independent with ADL's. She walks with a walker and does reports some balance problems but no recent fall. Her bowels are good, she denies urinary leakage. She denies joint pain, reflux or SOB. She reports her mood is good.   HTN: Her BP today is 137/71. She is taking Lisinopril HCT and Metoprolol as prescribed. ECG from 03/2021 reviewed.  CKD 3: Her last creatinine was 1.17, GFR 45. She is on Lisinopril for renal protection.  Hallucinations: visual, managed with Risperidal. RN denies any episodes of agitation or paranoia.  Review of Systems     Past Medical History:  Diagnosis Date   Gait disturbance    Hypertension    Low serum vitamin D    Stage 3b chronic kidney disease (HCC)     Current Outpatient Medications  Medication Sig Dispense Refill   lisinopril-hydrochlorothiazide (ZESTORETIC) 20-12.5 MG tablet Take 1 tablet by mouth in the morning and at bedtime.     metoprolol succinate (TOPROL-XL) 25 MG 24 hr tablet Take 1 tablet by mouth in the morning and at bedtime.     risperiDONE (RISPERDAL) 0.25 MG tablet Take 0.25 mg by mouth at bedtime.     No current facility-administered medications for this visit.    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  Tobacco Use   Smoking status: Never   Smokeless tobacco: Never  Substance and Sexual  Activity   Alcohol use: Yes    Comment: rare wine   Drug use: Not Currently   Sexual activity: Not on file  Other Topics Concern   Not on file  Social History Narrative   Widowed ~2012   5 children      Has living will   Son Onalee Hua is health care POA   Would accept resuscitation attempts   No feeding tube if cognitively unaware   Social Determinants of Health   Financial Resource Strain: Not on file  Food Insecurity: Not on file  Transportation Needs: Not on file  Physical Activity: Not on file  Stress: Not on file  Social Connections: Not on file  Intimate Partner Violence: Not on file     Constitutional: Denies fever, malaise, fatigue, headache or abrupt weight changes.  HEENT: Denies eye pain, eye redness, ear pain, ringing in the ears, wax buildup, runny nose, nasal congestion, bloody nose, or sore throat. Respiratory: Denies difficulty breathing, shortness of breath, cough or sputum production.   Cardiovascular: Denies chest pain, chest tightness, palpitations or swelling in the hands or feet.  Gastrointestinal: Denies abdominal pain, bloating, constipation, diarrhea or blood in the stool.  GU: Denies urgency, frequency, pain with urination, burning sensation, blood in urine, odor or discharge. Musculoskeletal: Denies decrease in range of motion, difficulty with gait, muscle  pain or joint pain and swelling.  Skin: Denies redness, rashes, lesions or ulcercations.  Neurological: Pt reports difficulty with balance. Denies dizziness, difficulty with memory, difficulty with speech or problems with coordination.  Psych: Pt reports visual hallucination. Denies anxiety, depression, SI/HI.  No other specific complaints in a complete review of systems (except as listed in HPI above).  Objective:   Physical Exam   BP 136/71   Pulse 63   Temp 97.8 F (36.6 C)   Resp 17   Wt 129 lb (58.5 kg)   SpO2 98%   BMI 24.37 kg/m  Wt Readings from Last 3 Encounters:  10/25/21 129  lb (58.5 kg)  07/05/21 120 lb (54.4 kg)  04/16/21 134 lb (60.8 kg)    General: Appears her stated age, well developed, well nourished in NAD. Skin: Warm, dry and intact.  Cardiovascular: Normal rate and rhythm. S1,S2 noted.  No murmur, rubs or gallops noted. No JVD or BLE edema.  Pulmonary/Chest: Normal effort and positive vesicular breath sounds. No respiratory distress. No wheezes, rales or ronchi noted.  Musculoskeletal: Gait slow and steady with use of walker. Neurological: Alert and oriented.  Psychiatric: Mood and affect normal.   BMET    Component Value Date/Time   NA 136 04/16/2021 1206   K 3.8 04/16/2021 1206   CL 98 04/16/2021 1206   CO2 26 04/16/2021 1206   GLUCOSE 102 (H) 04/16/2021 1206   BUN 38 (H) 04/16/2021 1206   CREATININE 1.17 (H) 04/16/2021 1206   CALCIUM 9.3 04/16/2021 1206   GFRNONAA 45 (L) 04/16/2021 1206    Lipid Panel  No results found for: CHOL, TRIG, HDL, CHOLHDL, VLDL, LDLCALC  CBC    Component Value Date/Time   WBC 5.1 04/16/2021 1206   RBC 3.61 (L) 04/16/2021 1206   HGB 12.5 04/16/2021 1206   HCT 35.6 (L) 04/16/2021 1206   PLT 238 04/16/2021 1206   MCV 98.6 04/16/2021 1206   MCH 34.6 (H) 04/16/2021 1206   MCHC 35.1 04/16/2021 1206   RDW 12.3 04/16/2021 1206   LYMPHSABS 1.2 04/16/2021 1206   MONOABS 0.6 04/16/2021 1206   EOSABS 0.2 04/16/2021 1206   BASOSABS 0.0 04/16/2021 1206    Hgb A1C No results found for: HGBA1C         Assessment & Plan:    Webb Silversmith, NP This visit occurred during the SARS-CoV-2 public health emergency.  Safety protocols were in place, including screening questions prior to the visit, additional usage of staff PPE, and extensive cleaning of exam room while observing appropriate contact time as indicated for disinfecting solutions.

## 2021-10-25 NOTE — Assessment & Plan Note (Signed)
Controlled on Lisinopril HCT and Metoprolol Will monitor 

## 2021-10-25 NOTE — Assessment & Plan Note (Signed)
Will check BMET annually Continue Lisinopril for renal protection

## 2022-01-10 ENCOUNTER — Other Ambulatory Visit: Payer: Self-pay

## 2022-01-10 ENCOUNTER — Ambulatory Visit: Payer: Medicare PPO | Admitting: Internal Medicine

## 2022-01-10 ENCOUNTER — Encounter: Payer: Self-pay | Admitting: Internal Medicine

## 2022-01-10 DIAGNOSIS — R29898 Other symptoms and signs involving the musculoskeletal system: Secondary | ICD-10-CM | POA: Insufficient documentation

## 2022-01-10 DIAGNOSIS — I10 Essential (primary) hypertension: Secondary | ICD-10-CM | POA: Diagnosis not present

## 2022-01-10 DIAGNOSIS — N1832 Chronic kidney disease, stage 3b: Secondary | ICD-10-CM

## 2022-01-10 DIAGNOSIS — R440 Auditory hallucinations: Secondary | ICD-10-CM | POA: Diagnosis not present

## 2022-01-10 NOTE — Assessment & Plan Note (Signed)
Last GFR 45 Is on lisinopril 20

## 2022-01-10 NOTE — Progress Notes (Signed)
Subjective:    Patient ID: Brittany Bryant, female    DOB: 04/23/1931, 88 y.o.   MRN: [id number]  HPI Visit in assisted living apartment for follow up of chronic health conditions Reviewed status with Brittany Salina RN  Having trouble with right hand "It doesn't do what I tell it to" Can't eat with it, etc Not painful Did improve with OT in the past--but is worse again  Does walk with rollator Assist with bathing and dressing (though she can do all except stockings) Uses bathroom---has pad for some urinary leakage  Still with auditory hallucinations Thought the risperdal helped a first--but not so sure now It doesn't bother her  Last GFR 45 No chest pain or SOB No dizziness or syncope No edema with support stockings No palpitations  Current Outpatient Medications on File Prior to Visit  Medication Sig Dispense Refill   lisinopril-hydrochlorothiazide (ZESTORETIC) 20-12.5 MG tablet Take 1 tablet by mouth in the morning and at bedtime.     metoprolol succinate (TOPROL-XL) 25 MG 24 hr tablet Take 1 tablet by mouth in the morning and at bedtime.     risperiDONE (RISPERDAL) 0.25 MG tablet Take 0.25 mg by mouth at bedtime.     No current facility-administered medications on file prior to visit.    No Known Allergies  Past Medical History:  Diagnosis Date   Gait disturbance    Hypertension    Low serum vitamin D    Stage 3b chronic kidney disease (HCC)     Past Surgical History:  Procedure Laterality Date   APPENDECTOMY     CATARACT EXTRACTION, BILATERAL     CORNEAL TRANSPLANT     TOTAL ABDOMINAL HYSTERECTOMY W/ BILATERAL SALPINGOOPHORECTOMY     TUBAL LIGATION      Family History  Problem Relation Age of Onset   Arthritis Father    COPD Father     Social History   Socioeconomic History   Marital status: Widowed    Spouse name: Not on file   Number of children: Not on file   Years of education: Not on file   Highest education level: Not on file  Occupational  History   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  Tobacco Use   Smoking status: Never   Smokeless tobacco: Never  Substance and Sexual Activity   Alcohol use: Yes    Comment: rare wine   Drug use: Not Currently   Sexual activity: Not on file  Other Topics Concern   Not on file  Social History Narrative   Widowed ~2012   5 children      Has living will   Son Brittany Bryant is health care POA   Would accept resuscitation attempts   No feeding tube if cognitively unaware   Social Determinants of Health   Financial Resource Strain: Not on file  Food Insecurity: Not on file  Transportation Needs: Not on file  Physical Activity: Not on file  Stress: Not on file  Social Connections: Not on file  Intimate Partner Violence: Not on file   Review of Systems Appetite is very good Weight stable  Not sleeping great---but better with melatonin 5mg . Not tired in the day Bowels move fine No other joint or back problems     Objective:   Physical Exam Constitutional:      Appearance: Normal appearance.  Cardiovascular:     Rate and Rhythm: Normal rate and regular rhythm.     Heart sounds: No murmur heard.  Pulmonary:     Effort: Pulmonary effort is normal.     Breath sounds: Normal breath sounds. No wheezing or rales.  Abdominal:     Palpations: Abdomen is soft.     Tenderness: There is no abdominal tenderness.  Musculoskeletal:     Cervical back: Neck supple.     Right lower leg: No edema.     Left lower leg: No edema.     Comments: Decreased strength in right hand---some muscle atrophy of intrinsic muscles Mild PIP thickening but no true synovitis  Lymphadenopathy:     Cervical: No cervical adenopathy.  Neurological:     Mental Status: She is alert.  Psychiatric:        Mood and Affect: Mood normal.        Behavior: Behavior normal.           Assessment & Plan:

## 2022-01-10 NOTE — Assessment & Plan Note (Signed)
Persists Unclear if risperdal 2.5 is helping--from neurologist Would not increase since they don't bother her---might want to consider trial off again

## 2022-01-10 NOTE — Assessment & Plan Note (Signed)
Did do better after OT----will set back up and then needs ongoing self exercise program Not really arthritic--just some atrophy

## 2022-01-10 NOTE — Assessment & Plan Note (Signed)
BP Readings from Last 3 Encounters:  01/10/22 (!) 164/78  10/25/21 136/71  07/05/21 (!) 164/81   Generally okay on lisinopril/HCTZ 20/25 and metoprolol 25 bid Will just monitor for now

## 2022-04-12 ENCOUNTER — Non-Acute Institutional Stay: Payer: Medicare PPO | Admitting: Internal Medicine

## 2022-04-12 ENCOUNTER — Encounter: Payer: Self-pay | Admitting: Internal Medicine

## 2022-04-12 DIAGNOSIS — I10 Essential (primary) hypertension: Secondary | ICD-10-CM

## 2022-04-12 DIAGNOSIS — N1832 Chronic kidney disease, stage 3b: Secondary | ICD-10-CM

## 2022-04-12 DIAGNOSIS — E871 Hypo-osmolality and hyponatremia: Secondary | ICD-10-CM | POA: Diagnosis not present

## 2022-04-12 DIAGNOSIS — D649 Anemia, unspecified: Secondary | ICD-10-CM

## 2022-04-12 NOTE — Assessment & Plan Note (Signed)
Controlled on Lisinopril HCT and Metoprolol CMET yearly Will monitor

## 2022-04-12 NOTE — Progress Notes (Signed)
Subjective:    Patient ID: Brittany Bryant, female    DOB: [birth year], 88 y.o.   MRN: [id number]  HPI  Resident seen in [address] Reviewed with RN, he reports resident has been complaining of fatigue. Resident reports it seems more like generalized weakness. She reports she sleeps well. She needs assist with bathing and dressing. She walks with a rolling walker. Her appetite is good, weight is stable. She denies any issues with bowel or bladder control. She denies joint pain, reflux, chest pain or SOB. She reports her mood is "fair"  HTN: Her BP today is 148/84. She is taking Lisinopril HCT and Metoprolol as prescribed. ECG from 03/2021 reviewed.  Hallucinations: This has been stable on her current dose of Risperdal.  Sleep Disturbance: She is taking Melatonin as prescribed. There is no sleep study on file.  CKD: Her last creatinine was 1.01, GFR 53, 09/2021. She is on Lisinopril for renal protection. She does not follow with nephrology.  Hyponatremia: Her last sodium was 127 , 03/2021. She is not on a fluid restriction.  Anemia: Her last H/H was 10.9/32, 09/2021. She is not taking any oral iron at this time. She does not follow with hematology.  Review of Systems  Past Medical History:  Diagnosis Date   Gait disturbance    Hypertension    Low serum vitamin D    Stage 3b chronic kidney disease (HCC)     Current Outpatient Medications  Medication Sig Dispense Refill   lisinopril-hydrochlorothiazide (ZESTORETIC) 20-12.5 MG tablet Take 1 tablet by mouth in the morning and at bedtime.     melatonin 5 MG TABS Take 5 mg by mouth at bedtime.     metoprolol succinate (TOPROL-XL) 25 MG 24 hr tablet Take 1 tablet by mouth in the morning and at bedtime.     risperiDONE (RISPERDAL) 0.25 MG tablet Take 0.25 mg by mouth at bedtime.     No current facility-administered medications for this visit.    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  Tobacco Use   Smoking status: Never   Smokeless tobacco: Never  Substance and Sexual Activity   Alcohol use: Yes    Comment: rare wine   Drug use: Not Currently   Sexual activity: Not on file  Other Topics Concern   Not on file  Social History Narrative   Widowed ~2012   5 children      Has living will   Son Shanon Brow is health care POA   Would accept resuscitation attempts   No feeding tube if cognitively unaware   Social Determinants of Health   Financial Resource Strain: Not on file  Food Insecurity: Not on file  Transportation Needs: Not on file  Physical Activity: Not on file  Stress: Not on file  Social Connections: Not on file  Intimate Partner Violence: Not on file     Constitutional: Patient reports fatigue.  Denies fever, malaise, headache or abrupt weight changes.  HEENT: Denies eye pain, eye redness, ear pain, ringing in the ears, wax buildup,  runny nose, nasal congestion, bloody nose, or sore throat. Respiratory: Denies difficulty breathing, shortness of breath, cough or sputum production.   Cardiovascular: Denies chest pain, chest tightness, palpitations or swelling in the hands or feet.  Gastrointestinal: Denies abdominal pain, bloating, constipation, diarrhea or blood in the stool.  GU: Denies urgency, frequency, pain with urination, burning sensation, blood in urine, odor or discharge. Musculoskeletal: Patient reports weakness of her lower extremities, difficulty with gait.  Denies decrease in range of motion, muscle pain or joint pain and swelling.  Skin: Denies redness, rashes, lesions or ulcercations.  Neurological: Patient reports difficulty with  balance.  Denies dizziness, difficulty with memory, difficulty with speech or problems with coordination.  Psych: Denies anxiety, depression, SI/HI.  No other specific complaints in a complete review of systems (except as listed in HPI above).     Objective:   Physical Exam  BP (!) 148/84   Pulse 68   Temp (!) 97.3 F (36.3 C)   Resp 17   Wt 131 lb (59.4 kg)   SpO2 98%   BMI 24.75 kg/m   Wt Readings from Last 3 Encounters:  01/10/22 128 lb 3.2 oz (58.2 kg)  10/25/21 129 lb (58.5 kg)  07/05/21 120 lb (54.4 kg)    General: Appears her stated age, chronically ill appearing, in NAD. Skin: Warm, dry and intact.  HEENT: Head: normal shape and size; Eyes: EOMs intact;  Neck:  No nodes or JVD noted. Cardiovascular: Normal rate and rhythm. S1,S2 noted.  No murmur, rubs or gallops noted. No JVD or BLE edema.  Pulmonary/Chest: Normal effort and positive vesicular breath sounds. No respiratory distress. No wheezes, rales or ronchi noted.  Abdomen: Soft and nontender. Normal bowel sounds.  Musculoskeletal: Gait not visualized. Neurological: Alert and oriented.  Psychiatric: Mood and affect normal.  BMET    Component Value Date/Time   NA 131 (A) 10/15/2021 0000   K 3.8 10/15/2021 0000   CL 98 04/16/2021 1206   CO2 26 04/16/2021 1206   GLUCOSE 102 (H) 04/16/2021 1206   BUN 1 (A) 10/15/2021 0000   CREATININE 1.17 (H) 04/16/2021 1206   CALCIUM 9.3 04/16/2021 1206   GFRNONAA 45 (L) 04/16/2021 1206    Lipid Panel  No results found for: CHOL, TRIG, HDL, CHOLHDL, VLDL, LDLCALC  CBC    Component Value Date/Time   WBC 5.1 04/16/2021 1206   RBC 3.28 (A) 10/15/2021 0000   HGB 10.9 (A) 10/15/2021 0000   HCT 32 (A) 10/15/2021 0000   PLT 238 04/16/2021 1206   MCV 98.6 04/16/2021 1206   MCH 34.6 (H) 04/16/2021 1206   MCHC 35.1 04/16/2021 1206   RDW 12.3 04/16/2021 1206   LYMPHSABS 1.2 04/16/2021 1206   MONOABS 0.6 04/16/2021 1206   EOSABS 0.2 04/16/2021 1206   BASOSABS 0.0  04/16/2021 1206    Hgb A1C No results found for: HGBA1C         Assessment & Plan:     Webb Silversmith, NP

## 2022-04-12 NOTE — Assessment & Plan Note (Signed)
No need for oral iron at this time Will monitor CBC

## 2022-04-12 NOTE — Assessment & Plan Note (Signed)
Will discuss with Dr. Alphonsus Sias about stopping HCT vs Risperdal dosing Will plan to repeat NA levels in 2 weeks

## 2022-04-12 NOTE — Assessment & Plan Note (Signed)
On Lisinopril for renal protection CMET yearly

## 2022-05-01 ENCOUNTER — Other Ambulatory Visit: Payer: Self-pay | Admitting: Internal Medicine

## 2022-05-01 MED ORDER — LISINOPRIL 20 MG PO TABS: 20.0000 mg | ORAL_TABLET | Freq: Every day | ORAL | 3 refills | Status: DC

## 2022-05-13 LAB — BASIC METABOLIC PANEL
BUN: 13 (ref 4–21)
CO2: 26 — AB (ref 13–22)
Chloride: 96 — AB (ref 99–108)
Creatinine: 1 (ref 0.5–1.1)
Glucose: 85
Potassium: 4.3 mEq/L (ref 3.5–5.1)
Sodium: 130 — AB (ref 137–147)

## 2022-05-13 LAB — COMPREHENSIVE METABOLIC PANEL: Calcium: 8.9 (ref 8.7–10.7)

## 2022-07-21 ENCOUNTER — Emergency Department: Payer: Medicare PPO

## 2022-07-21 ENCOUNTER — Emergency Department
Admission: EM | Admit: 2022-07-21 | Discharge: 2022-07-22 | Disposition: A | Discharge status: Home / Self Care | Payer: Medicare PPO | Attending: Emergency Medicine | Admitting: Emergency Medicine

## 2022-07-21 ENCOUNTER — Other Ambulatory Visit: Payer: Self-pay

## 2022-07-21 DIAGNOSIS — W1839XA Other fall on same level, initial encounter: Secondary | ICD-10-CM | POA: Diagnosis not present

## 2022-07-21 DIAGNOSIS — S0101XA Laceration without foreign body of scalp, initial encounter: Secondary | ICD-10-CM | POA: Insufficient documentation

## 2022-07-21 DIAGNOSIS — Z23 Encounter for immunization: Secondary | ICD-10-CM | POA: Insufficient documentation

## 2022-07-21 DIAGNOSIS — S0990XA Unspecified injury of head, initial encounter: Secondary | ICD-10-CM | POA: Diagnosis present

## 2022-07-21 DIAGNOSIS — N189 Chronic kidney disease, unspecified: Secondary | ICD-10-CM | POA: Insufficient documentation

## 2022-07-21 DIAGNOSIS — I129 Hypertensive chronic kidney disease with stage 1 through stage 4 chronic kidney disease, or unspecified chronic kidney disease: Secondary | ICD-10-CM | POA: Diagnosis not present

## 2022-07-21 DIAGNOSIS — S51819A Laceration without foreign body of unspecified forearm, initial encounter: Secondary | ICD-10-CM

## 2022-07-21 DIAGNOSIS — S81811A Laceration without foreign body, right lower leg, initial encounter: Secondary | ICD-10-CM | POA: Diagnosis not present

## 2022-07-21 DIAGNOSIS — S51811A Laceration without foreign body of right forearm, initial encounter: Secondary | ICD-10-CM | POA: Insufficient documentation

## 2022-07-21 LAB — BASIC METABOLIC PANEL
Anion gap: 10 (ref 5–15)
BUN: 19 mg/dL (ref 8–23)
CO2: 25 mmol/L (ref 22–32)
Calcium: 8.6 mg/dL — ABNORMAL LOW (ref 8.9–10.3)
Chloride: 96 mmol/L — ABNORMAL LOW (ref 98–111)
Creatinine, Ser: 1.01 mg/dL — ABNORMAL HIGH (ref 0.44–1.00)
GFR, Estimated: 53 mL/min — ABNORMAL LOW (ref >60)
Glucose, Bld: 122 mg/dL — ABNORMAL HIGH (ref 70–99)
Potassium: 3.6 mmol/L (ref 3.5–5.1)
Sodium: 131 mmol/L — ABNORMAL LOW (ref 135–145)

## 2022-07-21 LAB — CBC WITH DIFFERENTIAL/PLATELET
Abs Immature Granulocytes: 0.02 10*3/uL (ref 0.00–0.07)
Basophils Absolute: 0 10*3/uL (ref 0.0–0.1)
Basophils Relative: 1 %
Eosinophils Absolute: 0.1 10*3/uL (ref 0.0–0.5)
Eosinophils Relative: 1 %
HCT: 34.3 % — ABNORMAL LOW (ref 36.0–46.0)
Hemoglobin: 11.3 g/dL — ABNORMAL LOW (ref 12.0–15.0)
Immature Granulocytes: 0 %
Lymphocytes Relative: 18 %
Lymphs Abs: 1.1 10*3/uL (ref 0.7–4.0)
MCH: 32.6 pg (ref 26.0–34.0)
MCHC: 32.9 g/dL (ref 30.0–36.0)
MCV: 98.8 fL (ref 80.0–100.0)
Monocytes Absolute: 0.8 10*3/uL (ref 0.1–1.0)
Monocytes Relative: 12 %
Neutro Abs: 4.2 10*3/uL (ref 1.7–7.7)
Neutrophils Relative %: 68 %
Platelets: 257 10*3/uL (ref 150–400)
RBC: 3.47 MIL/uL — ABNORMAL LOW (ref 3.87–5.11)
RDW: 12.8 % (ref 11.5–15.5)
WBC: 6.2 10*3/uL (ref 4.0–10.5)
nRBC: 0 % (ref 0.0–0.2)

## 2022-07-21 MED ORDER — LIDOCAINE-EPINEPHRINE-TETRACAINE (LET) SOLUTION
3.0000 mL | Freq: Once | NASAL | Status: AC
  Administered 2022-07-21: 3 mL via TOPICAL
  Filled 2022-07-21 (×2): qty 3

## 2022-07-21 MED ORDER — TETANUS-DIPHTH-ACELL PERTUSSIS 5-2.5-18.5 LF-MCG/0.5 IM SUSY
0.5000 mL | PREFILLED_SYRINGE | Freq: Once | INTRAMUSCULAR | Status: AC
  Administered 2022-07-21: 0.5 mL via INTRAMUSCULAR

## 2022-07-21 NOTE — Discharge Instructions (Signed)
Your CT scan of the head and neck were okay today.  We gave you a tetanus vaccine today.  The laceration on the back of your head was repaired with absorbable stitches that will come apart on their own in about 2 weeks.  The skin tear on your right forearm was reapproximated with adhesive Steri-Strips.  Leave these in place for the next week to allow your skin to heal.  The laceration on your right lower leg was repaired with absorbable stitches that will come apart on their own in about 2 weeks.  Monitor the wounds for increased redness, swelling, pain, hot to touch, or drainage.  Follow-up with your doctor for further monitoring of your healing, or return to the ER if you have new or worsening symptoms

## 2022-07-21 NOTE — ED Provider Notes (Signed)
[facility] Provider Note    Event Date/Time   First MD Initiated Contact with Patient 07/21/22 2046     (approximate)   History   Chief Complaint: Fall   HPI  Brittany Bryant is a 88 y.o. female with a history of hypertension, CKD, ambulatory dysfunction who uses a walker at baseline who had a fall today.  She was trying to end up with her walker, when she lost her balance and fell back, sustaining injuries to the back of the head, right forearm, and right lower leg.  She denies loss of consciousness.  Denies neck pain or headache.  No vision changes or acute neuro symptoms.  Not sure when her last tetanus shot was.     Physical Exam   Triage Vital Signs: ED Triage Vitals  Enc Vitals Group     BP 07/21/22 1859 (!) 190/82     Pulse Rate 07/21/22 1859 76     Resp 07/21/22 1859 19     Temp 07/21/22 1859 98.2 F (36.8 C)     Temp src --      SpO2 07/21/22 1859 95 %     Weight 07/21/22 1858 130 lb (59 kg)     Height 07/21/22 1858 5\' 1"  (1.549 m)     Head Circumference --      Peak Flow --      Pain Score 07/21/22 1857 6     Pain Loc --      Pain Edu? --      Excl. in [place]? --     Most recent vital signs: Vitals:   07/21/22 1859 07/21/22 2100  BP: (!) 190/82 (!) 165/83  Pulse: 76 72  Resp: 19 16  Temp: 98.2 F (36.8 C)   SpO2: 95% 95%    General: Awake, no distress.  CV:  Good peripheral perfusion.  Regular rate and rhythm Resp:  Normal effort.  Clear to auscultation Abd:  No distention.  Nontender Other:  Full range of motion.  There is a 3 cm laceration on the occipital scalp with brisk pulsatile bleeding.  No skull depression or palpable fracture.  No battle sign or raccoon eyes or otorrhea.  There is a 6 cm skin tear on the right distal dorsal forearm.  Hemostatic.  There is a 5 cm curvilinear flap laceration on the right lower lateral leg extending through the fascia.  Tendon function is intact.  No bleeding.  Sensation  intact.   ED Results / Procedures / Treatments   Labs (all labs ordered are listed, but only abnormal results are displayed) Labs Reviewed  CBC WITH DIFFERENTIAL/PLATELET - Abnormal; Notable for the following components:      Result Value   RBC 3.47 (*)    Hemoglobin 11.3 (*)    HCT 34.3 (*)    All other components within normal limits  BASIC METABOLIC PANEL - Abnormal; Notable for the following components:   Sodium 131 (*)    Chloride 96 (*)    Glucose, Bld 122 (*)    Creatinine, Ser 1.01 (*)    Calcium 8.6 (*)    GFR, Estimated 53 (*)    All other components within normal limits     EKG    RADIOLOGY CT head interpreted by me, negative for intracranial hemorrhage.  Radiology report reviewed  CT cervical spine unremarkable.   PROCEDURES:  .08/29/23Laceration Repair  Date/Time: 07/21/2022 9:26 PM  Performed by: 07/23/2022, MD Authorized by: Sharman Cheek,  MD   Consent:    Consent obtained:  Verbal   Consent given by:  Patient   Risks discussed:  Infection, pain, retained foreign body, poor cosmetic result and poor wound healing Universal protocol:    Patient identity confirmed:  Verbally with patient Anesthesia:    Anesthesia method:  Local infiltration   Local anesthetic:  Lidocaine 2% WITH epi Laceration details:    Location:  Scalp   Scalp location:  Occipital   Length (cm):  3 Pre-procedure details:    Preparation:  Patient was prepped and draped in usual sterile fashion and imaging obtained to evaluate for foreign bodies Exploration:    Hemostasis achieved with:  Direct pressure, epinephrine and tied off vessels   Imaging outcome: foreign body not noted     Wound exploration: entire depth of wound visualized     Wound extent: vascular damage     Wound extent: no foreign bodies/material noted, no muscle damage noted, no nerve damage noted, no tendon damage noted and no underlying fracture noted     Contaminated: no   Treatment:    Area cleansed  with:  Saline and povidone-iodine   Amount of cleaning:  Extensive   Irrigation solution:  Sterile saline   Irrigation method:  Pressure wash   Visualized foreign bodies/material removed: no     Layers/structures repaired:  Deep subcutaneous Deep subcutaneous:    Suture size:  4-0   Suture material:  Monocryl   Suture technique:  Figure eight and running   Number of sutures:  6 Skin repair:    Repair method:  Sutures   Suture size:  2-0   Wound skin closure material used: monocryl.   Suture technique:  Running   Number of sutures:  4 Approximation:    Approximation:  Close Repair type:    Repair type:  Complex Post-procedure details:    Dressing:  Sterile dressing   Procedure completion:  Tolerated well, no immediate complications Comments:     Lac #1 of 3    .Marland KitchenLaceration Repair  Date/Time: 07/21/2022 9:28 PM  Performed by: Sharman Cheek, MD Authorized by: Sharman Cheek, MD   Consent:    Consent obtained:  Verbal   Consent given by:  Patient   Risks discussed:  Infection, pain, retained foreign body, poor cosmetic result and poor wound healing Anesthesia:    Anesthesia method:  Local infiltration   Local anesthetic:  Lidocaine 1% w/o epi Laceration details:    Location:  Shoulder/arm   Shoulder/arm location:  R lower arm   Length (cm):  6 Exploration:    Hemostasis achieved with:  Direct pressure   Contaminated: no   Treatment:    Area cleansed with:  Saline and povidone-iodine   Amount of cleaning:  Extensive   Irrigation solution:  Sterile saline   Visualized foreign bodies/material removed: no   Skin repair:    Repair method:  Steri-Strips   Number of Steri-Strips:  8 Approximation:    Approximation:  Close Repair type:    Repair type:  Simple Post-procedure details:    Dressing:  Sterile dressing   Procedure completion:  Tolerated well, no immediate complications Comments:     Lac #2 of 3     .Marland KitchenLaceration Repair  Date/Time: 07/21/2022  9:29 PM  Performed by: Sharman Cheek, MD Authorized by: Sharman Cheek, MD   Consent:    Consent obtained:  Verbal   Consent given by:  Patient   Risks discussed:  Infection, pain, retained foreign  body, poor cosmetic result and poor wound healing Universal protocol:    Patient identity confirmed:  Verbally with patient Anesthesia:    Anesthesia method:  Local infiltration   Local anesthetic:  Lidocaine 2% WITH epi Laceration details:    Location:  Leg   Leg location:  R lower leg   Length (cm):  5 Pre-procedure details:    Preparation:  Patient was prepped and draped in usual sterile fashion Exploration:    Hemostasis achieved with:  Direct pressure   Wound exploration: wound explored through full range of motion and entire depth of wound visualized     Wound extent: fascia violated     Wound extent: no foreign bodies/material noted, no muscle damage noted, no nerve damage noted, no tendon damage noted, no underlying fracture noted and no vascular damage noted     Contaminated: no   Treatment:    Area cleansed with:  Saline and povidone-iodine   Amount of cleaning:  Extensive   Irrigation solution:  Sterile saline   Irrigation method:  Pressure wash   Visualized foreign bodies/material removed: no     Layers/structures repaired:  Deep dermal/superficial fascia Deep dermal/superficial fascia:    Suture size:  2-0   Suture material:  Monocryl   Suture technique:  Horizontal mattress   Number of sutures:  1 Skin repair:    Repair method:  Sutures   Suture size:  4-0   Wound skin closure material used: monocryl.   Suture technique:  Running   Number of sutures:  10 Approximation:    Approximation:  Close Repair type:    Repair type:  Complex Post-procedure details:    Dressing:  Sterile dressing   Procedure completion:  Tolerated well, no immediate complications Comments:     Lac #3 of 3        MEDICATIONS ORDERED IN ED: Medications   lidocaine-EPINEPHrine-tetracaine (LET) solution (3 mLs Topical Given 07/21/22 2008)  Tdap (BOOSTRIX) injection 0.5 mL (0.5 mLs Intramuscular Given 07/21/22 2121)     IMPRESSION / MDM / ASSESSMENT AND PLAN / ED COURSE  I reviewed the triage vital signs and the nursing notes.                              Differential diagnosis includes, but is not limited to, intracranial hemorrhage, C-spine fracture, skull fracture, concussion  Patient's presentation is most consistent with acute presentation with potential threat to life or bodily function.  Patient presents with a mechanical fall with significant head injury resulting in arterial bleeding.  Vital signs and serum labs are unremarkable.  Hemoglobin at baseline.  On arrival to the treatment room, due to the arterial bleeding, wound repair had to be undertaken immediately.  Good hemostasis of the scalp wound was obtained, then it was cleaned and repaired fully.  The other wounds were also cleaned and repaired.  CT imaging is unremarkable.  Stable for discharge.       FINAL CLINICAL IMPRESSION(S) / ED DIAGNOSES   Final diagnoses:  Laceration of scalp, initial encounter  Skin tear of forearm without complication, initial encounter  Laceration of right lower leg, initial encounter     Rx / DC Orders   ED Discharge Orders     None        Note:  This document was prepared using Dragon voice recognition software and may include unintentional dictation errors.   Sharman Cheek, MD 07/21/22 2134

## 2022-07-21 NOTE — ED Notes (Signed)
Staff at [facility] states they will attempt to contact family to transport patient home, if they are unable to transport her home she will have to return via ambulance. Writer gave staff ER phone number to call back with updates.

## 2022-07-21 NOTE — ED Notes (Signed)
Provider at bedside suturing wound.

## 2022-07-21 NOTE — ED Triage Notes (Signed)
Pt in via EMS from [facility] with c/o fall. Pt ell trying to get in her walker in the dining area. Pt with 1 in lac to head, skin tear to right wrist and ankle. No LOC, no blood thinners.

## 2022-07-22 ENCOUNTER — Emergency Department
Admission: EM | Admit: 2022-07-22 | Discharge: 2022-07-22 | Disposition: A | Discharge status: Home / Self Care | Payer: Medicare PPO | Source: Home / Self Care | Attending: Emergency Medicine | Admitting: Emergency Medicine

## 2022-07-22 DIAGNOSIS — Z79899 Other long term (current) drug therapy: Secondary | ICD-10-CM | POA: Insufficient documentation

## 2022-07-22 DIAGNOSIS — W19XXXD Unspecified fall, subsequent encounter: Secondary | ICD-10-CM | POA: Insufficient documentation

## 2022-07-22 DIAGNOSIS — Z4800 Encounter for change or removal of nonsurgical wound dressing: Secondary | ICD-10-CM | POA: Insufficient documentation

## 2022-07-22 DIAGNOSIS — I129 Hypertensive chronic kidney disease with stage 1 through stage 4 chronic kidney disease, or unspecified chronic kidney disease: Secondary | ICD-10-CM | POA: Insufficient documentation

## 2022-07-22 DIAGNOSIS — Z5189 Encounter for other specified aftercare: Secondary | ICD-10-CM

## 2022-07-22 DIAGNOSIS — N1831 Chronic kidney disease, stage 3a: Secondary | ICD-10-CM | POA: Insufficient documentation

## 2022-07-22 DIAGNOSIS — S81811D Laceration without foreign body, right lower leg, subsequent encounter: Secondary | ICD-10-CM | POA: Insufficient documentation

## 2022-07-22 MED ORDER — ACETAMINOPHEN 500 MG PO TABS
500.0000 mg | ORAL_TABLET | Freq: Once | ORAL | Status: AC
  Administered 2022-07-22: 500 mg via ORAL
  Filled 2022-07-22: qty 1

## 2022-07-22 NOTE — ED Provider Notes (Signed)
[facility] Provider Note    Event Date/Time   First MD Initiated Contact with Patient 07/22/22 [phone]     (approximate)   History   Wound Check   HPI  Brittany Bryant is a 88 y.o. female sent back to the ED from [facility] for wound check of her right lower leg.  Patient was seen earlier in the ED status post fall and required sutures to her right lower leg.  Patient voices no complaints other than soreness.     Past Medical History   Past Medical History:  Diagnosis Date  . Gait disturbance   . Hypertension   . Low serum vitamin D   . Stage 3b chronic kidney disease [facility])      Active Problem List   Patient Active Problem List   Diagnosis Date Noted  . Anemia 04/12/2022  . Hyponatremia 04/12/2022  . Auditory hallucinations 07/05/2021  . Hypertension 07/03/2021  . Stage 3b chronic kidney disease ([place]) 07/03/2021     Past Surgical History   Past Surgical History:  Procedure Laterality Date  . APPENDECTOMY    . BACK SURGERY    . CATARACT EXTRACTION, BILATERAL    . CORNEAL TRANSPLANT    . TOTAL ABDOMINAL HYSTERECTOMY W/ BILATERAL SALPINGOOPHORECTOMY    . TUBAL LIGATION       Home Medications   Prior to Admission medications   Medication Sig Start Date End Date Taking? Authorizing Provider  lisinopril (ZESTRIL) 20 MG tablet Take 1 tablet (20 mg total) by mouth daily. 05/01/22   Jearld Fenton, NP  melatonin 5 MG TABS Take 5 mg by mouth at bedtime.    [provider]  metoprolol succinate (TOPROL-XL) 25 MG 24 hr tablet Take 1 tablet by mouth in the morning and at bedtime. 01/18/20   [provider]  risperiDONE (RISPERDAL) 0.25 MG tablet Take 0.25 mg by mouth at bedtime.    [provider]     Allergies  Patient has no known allergies.   Family History   Family History  Problem Relation Age of Onset  . Arthritis Father   . COPD Father      Physical Exam  Triage Vital Signs: ED Triage Vitals  [07/22/22 0113]  Enc Vitals Group     BP (!) 159/84     Pulse Rate 71     Resp 15     Temp 98 F (36.7 C)     Temp src      SpO2 96 %     Weight      Height      Head Circumference      Peak Flow      Pain Score      Pain Loc      Pain Edu?      Excl. in [place]?     Updated Vital Signs: BP (!) 159/84   Pulse 71   Temp 98 F (36.7 C)   Resp 15   SpO2 96%    General: Awake, no distress.  CV:  Good peripheral perfusion.  Resp:  Normal effort.  Abd:  No distention.  Other:  Right lower leg wound: Due to the nature of patient's thin skin, lower sutures have pulled apart from the skin, creating small open wound which is nonbleeding.   ED Results / Procedures / Treatments  Labs (all labs ordered are listed, but only abnormal results are displayed) Labs Reviewed - No data to display  EKG  None   RADIOLOGY None   Official radiology report(s): CT Head Wo Contrast  Result Date: 07/21/2022 CLINICAL DATA:  Fall, head laceration EXAM: CT HEAD WITHOUT CONTRAST CT CERVICAL SPINE WITHOUT CONTRAST TECHNIQUE: Multidetector CT imaging of the head and cervical spine was performed following the standard protocol without intravenous contrast. Multiplanar CT image reconstructions of the cervical spine were also generated. RADIATION DOSE REDUCTION: This exam was performed according to the departmental dose-optimization program which includes automated exposure control, adjustment of the mA and/or kV according to patient size and/or use of iterative reconstruction technique. COMPARISON:  CT head dated 04/16/2021 FINDINGS: CT HEAD FINDINGS Brain: No evidence of acute infarction, hemorrhage, hydrocephalus, extra-axial collection or mass lesion/mass effect. Septum cavum pellucidum et vergae. Subcortical white matter and periventricular small vessel ischemic changes. Vascular: No hyperdense vessel or unexpected calcification. Skull: Normal. Negative for fracture or focal lesion. Sinuses/Orbits:  The visualized paranasal sinuses are essentially clear. The mastoid air cells are unopacified. Other: None. CT CERVICAL SPINE FINDINGS Alignment: Mild reversal of the mid cervical lordosis, degenerative. Skull base and vertebrae: No acute fracture. No primary bone lesion or focal pathologic process. Soft tissues and spinal canal: No prevertebral fluid or swelling. No visible canal hematoma. Disc levels: Mild multilevel degenerative changes, most prominent at C5-6. Spinal canal is patent. Upper chest: Visualized lung apices are clear. Other: Visualized thyroid is notable for a 10 mm right thyroid nodule, benign. Not clinically significant; no follow-up imaging recommended (ref: J Am Coll Radiol. 2015 Feb;12(2): 143-50). IMPRESSION: No evidence of acute intracranial abnormality. Small vessel ischemic changes. No evidence of acute traumatic injury to the cervical spine. Mild degenerative changes. Electronically Signed   By: Charline Bills M.D.   On: 07/21/2022 21:05   CT Cervical Spine Wo Contrast  Result Date: 07/21/2022 CLINICAL DATA:  Fall, head laceration EXAM: CT HEAD WITHOUT CONTRAST CT CERVICAL SPINE WITHOUT CONTRAST TECHNIQUE: Multidetector CT imaging of the head and cervical spine was performed following the standard protocol without intravenous contrast. Multiplanar CT image reconstructions of the cervical spine were also generated. RADIATION DOSE REDUCTION: This exam was performed according to the departmental dose-optimization program which includes automated exposure control, adjustment of the mA and/or kV according to patient size and/or use of iterative reconstruction technique. COMPARISON:  CT head dated 04/16/2021 FINDINGS: CT HEAD FINDINGS Brain: No evidence of acute infarction, hemorrhage, hydrocephalus, extra-axial collection or mass lesion/mass effect. Septum cavum pellucidum et vergae. Subcortical white matter and periventricular small vessel ischemic changes. Vascular: No hyperdense vessel  or unexpected calcification. Skull: Normal. Negative for fracture or focal lesion. Sinuses/Orbits: The visualized paranasal sinuses are essentially clear. The mastoid air cells are unopacified. Other: None. CT CERVICAL SPINE FINDINGS Alignment: Mild reversal of the mid cervical lordosis, degenerative. Skull base and vertebrae: No acute fracture. No primary bone lesion or focal pathologic process. Soft tissues and spinal canal: No prevertebral fluid or swelling. No visible canal hematoma. Disc levels: Mild multilevel degenerative changes, most prominent at C5-6. Spinal canal is patent. Upper chest: Visualized lung apices are clear. Other: Visualized thyroid is notable for a 10 mm right thyroid nodule, benign. Not clinically significant; no follow-up imaging recommended (ref: J Am Coll Radiol. 2015 Feb;12(2): 143-50). IMPRESSION: No evidence of acute intracranial abnormality. Small vessel ischemic changes. No evidence of acute traumatic injury to the cervical spine. Mild degenerative changes. Electronically Signed   By: Charline Bills M.D.   On: 07/21/2022 21:05     PROCEDURES:  Critical Care performed: No  Procedures   MEDICATIONS ORDERED IN ED: Medications  acetaminophen (TYLENOL) tablet 500 mg (has no administration in time range)     IMPRESSION / MDM / ASSESSMENT AND PLAN / ED COURSE  I reviewed the triage vital signs and the nursing notes.                             88 year old female sent back to the ED for recheck of right lower leg wound.  Will cleanse wound, apply Xeroform and dressing.  Administer Tylenol for soreness.  Strict return precautions given.  Patient verbalizes understanding and agrees with plan of care.  Patient's presentation is most consistent with acute, uncomplicated illness.   FINAL CLINICAL IMPRESSION(S) / ED DIAGNOSES   Final diagnoses:  Visit for wound check     Rx / DC Orders   ED Discharge Orders     None        Note:  This document was  prepared using Dragon voice recognition software and may include unintentional dictation errors.   Irean Hong, MD 07/22/22 [phone]

## 2022-07-22 NOTE — ED Notes (Addendum)
Pts leg dressed with curlex and nonadherent gauze.   Discussed D/C information with the SNF [facility] - staff verbalized understanding.

## 2022-07-22 NOTE — ED Notes (Signed)
Pt repeatedly asking for staff to call [facility] because she wants to go home.

## 2022-07-22 NOTE — Discharge Instructions (Signed)
Keep wound clean and dry.  Dress wound with nonadherent dressing daily until healed.  Return to the ER for worsening symptoms, increased redness/swelling, purulent discharge or other concerns.

## 2022-07-22 NOTE — ED Notes (Signed)
Pt DC from ER while this RN was with a critical patient. Unable to give discharge instructions by this RN

## 2022-07-22 NOTE — ED Triage Notes (Signed)
Pt sent here from [facility] for a wound check. Pt was seen earlier after a fall. Pt had wound on her leg from fall and facility sent pt back for a recheck of her wound that was assessed during her first visit.

## 2022-08-20 ENCOUNTER — Encounter: Payer: Self-pay | Admitting: Nurse Practitioner

## 2022-08-20 ENCOUNTER — Non-Acute Institutional Stay: Payer: Medicare PPO | Admitting: Nurse Practitioner

## 2022-08-20 DIAGNOSIS — R195 Other fecal abnormalities: Secondary | ICD-10-CM

## 2022-08-20 DIAGNOSIS — I10 Essential (primary) hypertension: Secondary | ICD-10-CM

## 2022-08-20 DIAGNOSIS — N1832 Chronic kidney disease, stage 3b: Secondary | ICD-10-CM | POA: Diagnosis not present

## 2022-08-20 DIAGNOSIS — D649 Anemia, unspecified: Secondary | ICD-10-CM | POA: Diagnosis not present

## 2022-08-20 DIAGNOSIS — E871 Hypo-osmolality and hyponatremia: Secondary | ICD-10-CM

## 2022-08-20 MED ORDER — LISINOPRIL 40 MG PO TABS: 20.0000 mg | ORAL_TABLET | Freq: Every day | ORAL | Status: DC

## 2022-08-20 NOTE — Progress Notes (Signed)
Location:  Other [facility]) Nursing Home Room Number: 210 Place of Service:  ALF [phone]) Provider:  Dewayne Shorter, MD  Patient Care Team: Dewayne Shorter, MD as PCP - General (Family Medicine) Lauree Chandler, NP as Nurse Practitioner (Geriatric Medicine)  Extended Emergency Contact Information Primary Emergency Contact: [facility] Address: [place], [place] [address] Johnnette Litter of [place] Phone: [phone] Mobile Phone: [phone] Relation: Son Secondary Emergency Contact: Rousseau,[name] Address: [address] [place] [address] Johnnette Litter of [place] Work Phone: [phone] Mobile Phone: [phone] Relation: Son  Code Status:  Full Code Goals of care: Advanced Directive information    08/20/2022   12:15 PM  Advanced Directives  Does Patient Have a Medical Advance Directive? No  Would patient like information on creating a medical advance directive? No - Patient declined     Chief Complaint  Patient presents with   Medical Management of Chronic Issues    Routine visit. Discuss need for covid boosters, PCV, DEXA, and flu vaccine or post pone if patient refuses.     HPI:  Pt is a 88 y.o. female seen today for medical management of chronic diseases.   She reports she has not felt well for about a week  Had diarrhea which has improved but continue to have loose stools  Has had frequent falls. Completed PT.   No pain, no fever.   Htn- taking metoprolol amd lisinopril.   Sleeps well at night.   She is eating 3 meals a day  Denies anxiety or depression.   Her right hand has a lot of issues, does not grab onto things and it makes it harder to hold her walker. She has been to multiple specialist and they are not able to tell her what is wrong. Reports this is why she has had falls because she can not hold onto her walker. The last fall she suffered a laceration but did not need sutures.     Past Medical History:   Diagnosis Date   Anemia    Per [facility] Records   Carpal tunnel syndrome    Per [facility] Records   Difficulty walking    Per [employer] Records   Gait disturbance    Hypertension    Low serum vitamin D    Stage 3b chronic kidney disease ([place])    Tinnitus    Per [facility] Records   Past Surgical History:  Procedure Laterality Date   APPENDECTOMY     BACK SURGERY     CATARACT EXTRACTION, BILATERAL     CORNEAL TRANSPLANT     TOTAL ABDOMINAL HYSTERECTOMY W/ BILATERAL SALPINGOOPHORECTOMY     TUBAL LIGATION      No Known Allergies  Outpatient Encounter Medications as of 08/20/2022  Medication Sig   carbamide peroxide (DEBROX) 6.5 % OTIC solution Place 5 drops into both ears as needed.   cetirizine (ZYRTEC) 10 MG tablet Take 10 mg by mouth as needed for allergies.   lisinopril (ZESTRIL) 20 MG tablet Take 1 tablet (20 mg total) by mouth daily.   melatonin 5 MG TABS Take 5 mg by mouth at bedtime.   metoprolol succinate (TOPROL-XL) 25 MG 24 hr tablet Take 1 tablet by mouth in the morning and at bedtime.   Wheat Dextrin (BENEFIBER DRINK MIX PO) 2 tsp by mouth one time daily for loose stools   [DISCONTINUED] risperiDONE (  RISPERDAL) 0.25 MG tablet Take 0.25 mg by mouth at bedtime.   No facility-administered encounter medications on file as of 08/20/2022.    Review of Systems  Constitutional:  Negative for activity change, appetite change, fatigue and unexpected weight change.  HENT:  Negative for congestion and hearing loss.   Eyes: Negative.   Respiratory:  Negative for cough and shortness of breath.   Cardiovascular:  Negative for chest pain, palpitations and leg swelling.  Gastrointestinal:  Positive for diarrhea. Negative for abdominal pain and constipation.  Genitourinary:  Negative for difficulty urinating and dysuria.  Musculoskeletal:  Negative for arthralgias and myalgias.  Skin:  Negative for color change and wound.  Neurological:  Positive for weakness (at  baseline). Negative for dizziness and light-headedness.  Psychiatric/Behavioral:  Negative for agitation, behavioral problems and confusion.     Immunization History  Administered Date(s) Administered   Moderna Covid-19 Vaccine Bivalent Booster 62yrs & up 04/12/2021   Moderna SARS-COV2 Booster Vaccination 10/10/2020   Moderna Sars-Covid-2 Vaccination 11/30/2019, 01/07/2020   Tdap 07/21/2022   Pertinent  Health Maintenance Due  Topic Date Due   DEXA SCAN  Never done   INFLUENZA VACCINE  06/25/2022      04/16/2021   12:12 PM 07/21/2022    6:58 PM 07/22/2022    1:12 AM  Fall Risk  Patient Fall Risk Level High fall risk Low fall risk Low fall risk   Functional Status Survey:    Vitals:   08/20/22 1158  BP: (!) 148/80  Pulse: 62  Resp: 18  Temp: 97.8 F (36.6 C)  SpO2: 96%  Weight: 128 lb 3.2 oz (58.2 kg)  Height: 5\' 1"  (1.549 m)   Body mass index is 24.22 kg/m.  Filed Weights   08/20/22 1158  Weight: 128 lb 3.2 oz (58.2 kg)    Physical Exam  Labs reviewed: Recent Labs    10/15/21 0000 05/13/22 0000 07/21/22 1909  NA 131* 130* 131*  K 3.8 4.3 3.6  CL  --  96* 96*  CO2  --  26* 25  GLUCOSE  --   --  122*  BUN 1* 13 19  CREATININE  --  1.0 1.01*  CALCIUM  --  8.9 8.6*   No results for input(s): "AST", "ALT", "ALKPHOS", "BILITOT", "PROT", "ALBUMIN" in the last 8760 hours. Recent Labs    10/15/21 0000 07/21/22 1909  WBC  --  6.2  NEUTROABS  --  4.2  HGB 10.9* 11.3*  HCT 32* 34.3*  MCV  --  98.8  PLT  --  257   No results found for: "TSH" No results found for: "HGBA1C" No results found for: "CHOL", "HDL", "LDLCALC", "LDLDIRECT", "TRIG", "CHOLHDL"  Significant Diagnostic Results in last 30 days:  CT Head Wo Contrast  Result Date: 07/21/2022 CLINICAL DATA:  Fall, head laceration EXAM: CT HEAD WITHOUT CONTRAST CT CERVICAL SPINE WITHOUT CONTRAST TECHNIQUE: Multidetector CT imaging of the head and cervical spine was performed following the standard  protocol without intravenous contrast. Multiplanar CT image reconstructions of the cervical spine were also generated. RADIATION DOSE REDUCTION: This exam was performed according to the departmental dose-optimization program which includes automated exposure control, adjustment of the mA and/or kV according to patient size and/or use of iterative reconstruction technique. COMPARISON:  CT head dated 04/16/2021 FINDINGS: CT HEAD FINDINGS Brain: No evidence of acute infarction, hemorrhage, hydrocephalus, extra-axial collection or mass lesion/mass effect. Septum cavum pellucidum et vergae. Subcortical white matter and periventricular small vessel ischemic changes. Vascular: No  hyperdense vessel or unexpected calcification. Skull: Normal. Negative for fracture or focal lesion. Sinuses/Orbits: The visualized paranasal sinuses are essentially clear. The mastoid air cells are unopacified. Other: None. CT CERVICAL SPINE FINDINGS Alignment: Mild reversal of the mid cervical lordosis, degenerative. Skull base and vertebrae: No acute fracture. No primary bone lesion or focal pathologic process. Soft tissues and spinal canal: No prevertebral fluid or swelling. No visible canal hematoma. Disc levels: Mild multilevel degenerative changes, most prominent at C5-6. Spinal canal is patent. Upper chest: Visualized lung apices are clear. Other: Visualized thyroid is notable for a 10 mm right thyroid nodule, benign. Not clinically significant; no follow-up imaging recommended (ref: J Am Coll Radiol. 2015 Feb;12(2): 143-50). IMPRESSION: No evidence of acute intracranial abnormality. Small vessel ischemic changes. No evidence of acute traumatic injury to the cervical spine. Mild degenerative changes. Electronically Signed   By: Julian Hy M.D.   On: 07/21/2022 21:05   CT Cervical Spine Wo Contrast  Result Date: 07/21/2022 CLINICAL DATA:  Fall, head laceration EXAM: CT HEAD WITHOUT CONTRAST CT CERVICAL SPINE WITHOUT CONTRAST  TECHNIQUE: Multidetector CT imaging of the head and cervical spine was performed following the standard protocol without intravenous contrast. Multiplanar CT image reconstructions of the cervical spine were also generated. RADIATION DOSE REDUCTION: This exam was performed according to the departmental dose-optimization program which includes automated exposure control, adjustment of the mA and/or kV according to patient size and/or use of iterative reconstruction technique. COMPARISON:  CT head dated 04/16/2021 FINDINGS: CT HEAD FINDINGS Brain: No evidence of acute infarction, hemorrhage, hydrocephalus, extra-axial collection or mass lesion/mass effect. Septum cavum pellucidum et vergae. Subcortical white matter and periventricular small vessel ischemic changes. Vascular: No hyperdense vessel or unexpected calcification. Skull: Normal. Negative for fracture or focal lesion. Sinuses/Orbits: The visualized paranasal sinuses are essentially clear. The mastoid air cells are unopacified. Other: None. CT CERVICAL SPINE FINDINGS Alignment: Mild reversal of the mid cervical lordosis, degenerative. Skull base and vertebrae: No acute fracture. No primary bone lesion or focal pathologic process. Soft tissues and spinal canal: No prevertebral fluid or swelling. No visible canal hematoma. Disc levels: Mild multilevel degenerative changes, most prominent at C5-6. Spinal canal is patent. Upper chest: Visualized lung apices are clear. Other: Visualized thyroid is notable for a 10 mm right thyroid nodule, benign. Not clinically significant; no follow-up imaging recommended (ref: J Am Coll Radiol. 2015 Feb;12(2): 143-50). IMPRESSION: No evidence of acute intracranial abnormality. Small vessel ischemic changes. No evidence of acute traumatic injury to the cervical spine. Mild degenerative changes. Electronically Signed   By: Julian Hy M.D.   On: 07/21/2022 21:05    Assessment/Plan 1. Loose stools -has improved with  benefiber  will increase benefiber to twice daily   2. Primary hypertension -recently more elevated.  -low sodium diet  -will increase lisinopril to 40 mg daily and have staff continue to monitor bp  3. Stage 3b chronic kidney disease ([place]) -will follow up bmp Encourage proper hydration Avoid nephrotoxic meds (NSAIDS)  4. Anemia, unspecified type -will follow up cbc  5. Hyponatremia -noted during ED visit, she was previously on lisinopril with Hctz now just taking lisinopril.   Carlos American. [place], [facility] [phone]

## 2022-09-06 ENCOUNTER — Non-Acute Institutional Stay (SKILLED_NURSING_FACILITY): Payer: Medicare PPO | Admitting: Student

## 2022-09-06 ENCOUNTER — Encounter: Payer: Self-pay | Admitting: Student

## 2022-09-06 DIAGNOSIS — I10 Essential (primary) hypertension: Secondary | ICD-10-CM

## 2022-09-06 DIAGNOSIS — R296 Repeated falls: Secondary | ICD-10-CM

## 2022-09-06 DIAGNOSIS — E871 Hypo-osmolality and hyponatremia: Secondary | ICD-10-CM | POA: Diagnosis not present

## 2022-09-06 DIAGNOSIS — N1832 Chronic kidney disease, stage 3b: Secondary | ICD-10-CM | POA: Diagnosis not present

## 2022-09-06 DIAGNOSIS — D649 Anemia, unspecified: Secondary | ICD-10-CM

## 2022-09-06 NOTE — Progress Notes (Signed)
Provider:  Dr. Dewayne Shorter Location:  [place] Room Number: [place] of Service:  SNF [phone])  PCP: Dewayne Shorter, MD Patient Care Team: Dewayne Shorter, MD as PCP - General (Family Medicine) Lauree Chandler, NP as Nurse Practitioner (Geriatric Medicine)  Extended Emergency Contact Information Primary Emergency Contact: [facility] Address: [address]          [place], [place] [address] Johnnette Litter of [place] Phone: [phone] Mobile Phone: [phone] Relation: Son Secondary Emergency Contact: Schoppe,[name] Address: [address] [place] [address] Johnnette Litter of [place] Work Phone: [phone] Mobile Phone: [phone] Relation: Son  Code Status: Full Code Goals of Care: Advanced Directive information    08/20/2022   12:15 PM  Advanced Directives  Does Patient Have a Medical Advance Directive? No  Would patient like information on creating a medical advance directive? No - Patient declined      Chief Complaint  Patient presents with   New Admit To SNF    New Admit to SNF    HPI: Patient is a 88 y.o. female seen today for admission to [facility].  She has fallen three times in the last year.   Most significant was 8/27 she had a fall in the dining room and had a few cuts that didnt' require stitches.  She decided after the falls wanted to move to helathcare. Hopes to return to [place].   Denies symptoms with the falls. Now has fear of falling.  She wears bifocals. She doesn't remember ever having different ones She wears hearing aids.  No srensory changing in hre feet.  For exercise she used to walk 3 miles per day, but now she trouble walking. She starts to loose energy and is afraid she will fall. Uses rollator at baseline. No Driving. She eats in the dining room. They help with bathing. She gets provided activities. She used to manage her own medications until the last 2 weeks. [name] has tken over  her medications and decided that she needed ot have them managed. She's happy to have one less thing to worry about.  She has a son in [place], 2 in [place], 1 in [place]. Oldest son was adopted and had 4 healthy children.   She lived in a house since 2008, moved to [place] for a little over a year.   Past Medical History:  Diagnosis Date   Anemia    Per [facility] Records   Carpal tunnel syndrome    Per [facility] Records   Difficulty walking    Per [employer] Records   Gait disturbance    Hypertension    Low serum vitamin D    Stage 3b chronic kidney disease ([place])    Tinnitus    Per [facility] Records   Past Surgical History:  Procedure Laterality Date   APPENDECTOMY     BACK SURGERY     CATARACT EXTRACTION, BILATERAL     CORNEAL TRANSPLANT     TOTAL ABDOMINAL HYSTERECTOMY W/ BILATERAL SALPINGOOPHORECTOMY     TUBAL LIGATION      reports that she has never smoked. She has never used smokeless tobacco. She reports current alcohol use. She reports that she does not currently use drugs. 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  Tobacco Use   Smoking status: Never   Smokeless tobacco: Never  Vaping Use   Vaping Use: Never used  Substance and Sexual Activity   Alcohol use: Yes    Comment: rare wine   Drug use: Not Currently   Sexual activity: Not on file  Other Topics Concern   Not on file  Social History Narrative   Widowed ~2012   5 children      Has living will   Son Shanon Brow is health care POA   Would accept resuscitation attempts   No feeding tube if cognitively unaware   Social Determinants of Health   Financial Resource Strain: Not on file  Food Insecurity: Not on file  Transportation Needs: Not on file  Physical  Activity: Not on file  Stress: Not on file  Social Connections: Not on file  Intimate Partner Violence: Not on file    Functional Status Survey:    Family History  Problem Relation Age of Onset   Arthritis Father    COPD Father     Health Maintenance  Topic Date Due   DEXA SCAN  Never done   INFLUENZA VACCINE  06/25/2022   TETANUS/TDAP  07/21/2032   Pneumonia Vaccine 29+ Years old  Completed   COVID-19 Vaccine  Completed   Zoster Vaccines- Shingrix  Completed   HPV VACCINES  Aged Out    No Known Allergies  Outpatient Encounter Medications as of 09/06/2022  Medication Sig   lisinopril (ZESTRIL) 40 MG tablet Take 40 mg by mouth daily.   melatonin 5 MG TABS Take 5 mg by mouth at bedtime.   metoprolol succinate (TOPROL-XL) 25 MG 24 hr tablet Take 1 tablet by mouth in the morning and at bedtime.   Wheat Dextrin (BENEFIBER DRINK MIX PO) 2 tsp by mouth two times daily for loose stools   [DISCONTINUED] carbamide peroxide (DEBROX) 6.5 % OTIC solution Place 5 drops into both ears as needed.   [DISCONTINUED] cetirizine (ZYRTEC) 10 MG tablet Take 10 mg by mouth as needed for allergies.   [DISCONTINUED] lisinopril (ZESTRIL) 40 MG tablet Take 0.5 tablets (20 mg total) by mouth daily. (Patient taking differently: Take 40 mg by mouth daily.)   No facility-administered encounter medications on file as of 09/06/2022.    Review of Systems  Vitals:   09/06/22 1025  BP: (!) 154/66  Pulse: 72  Resp: (!) 22  Temp: (!) 96.8 F (36 C)  SpO2: 96%  Weight: 131 lb (59.4 kg)  Height: 5\' 1"  (1.549 m)   Body mass index is 24.75 kg/m. Physical Exam  Labs reviewed: Basic Metabolic Panel: Recent Labs    10/15/21 0000 05/13/22 0000 07/21/22 1909  NA 131* 130* 131*  K 3.8 4.3 3.6  CL  --  96* 96*  CO2  --  26* 25  GLUCOSE  --   --  122*  BUN 1* 13 19  CREATININE  --  1.0 1.01*  CALCIUM  --  8.9 8.6*   Liver Function Tests: No results for input(s): "AST", "ALT", "ALKPHOS",  "BILITOT", "PROT", "ALBUMIN" in the last 8760 hours. No results for input(s): "LIPASE", "AMYLASE" in the last 8760 hours. No results for input(s): "AMMONIA" in the last 8760 hours. CBC: Recent Labs    10/15/21 0000 07/21/22 1909  WBC  --  6.2  NEUTROABS  --  4.2  HGB 10.9* 11.3*  HCT 32* 34.3*  MCV  --  98.8  PLT  --  257   Cardiac Enzymes: No results for input(s): "CKTOTAL", "CKMB", "CKMBINDEX", "TROPONINI" in the last 8760 hours. BNP: Invalid input(s): "POCBNP" No results found for: "HGBA1C" No results found for: "TSH" No results found for: "VITAMINB12" No results found for: "FOLATE" No results found for: "IRON", "TIBC", "FERRITIN"  Imaging and Procedures obtained prior to SNF admission: No results found.  Assessment/Plan 1. Frequent falls Patient has had numerous falls with skin tears as injury. Right sided weakness concerning for underlying cause. She has had 3 evaluations over the years and minimal improvement. Uses a Rolator at baseline. Will continue PT/OT while here. Hopes to return to AL at the time of discharge. NO medications to contribute to the complex issue.   2. Stage 3b chronic kidney disease ([place]) Patient's renal function stable for 1 year. Continue to monitor.  Creatinine, Ser  Date Value Ref Range Status  07/21/2022 1.01 (H) 0.44 - 1.00 mg/dL Final  ]  3. Primary hypertension Continue Lisinopril 40 mg daily for blood pressure.   4. Hyponatremia Sodium chronically low at 131. Consistent with labs in 2022. Mental status at baseline.    5. Anemia, unspecified type Hgb 11.3 8/27. No signs of bleeding at this time. Repeat CBC and BMP to determine current level of hemoglobin since 8/27. If persistently low will start iron supplementation.   Family/ staff Communication: Nursing updated  Labs/tests ordered: BMP, CBC, Fe panel  Tomasa Rand, MD, [facility] [phone]

## 2022-09-23 ENCOUNTER — Encounter: Payer: Self-pay | Admitting: Student

## 2022-09-23 ENCOUNTER — Non-Acute Institutional Stay (SKILLED_NURSING_FACILITY): Payer: Medicare PPO | Admitting: Student

## 2022-09-23 DIAGNOSIS — I10 Essential (primary) hypertension: Secondary | ICD-10-CM

## 2022-09-23 MED ORDER — AMLODIPINE BESYLATE 2.5 MG PO TABS: 2.5000 mg | ORAL_TABLET | Freq: Every day | ORAL | Status: DC

## 2022-09-23 NOTE — Progress Notes (Signed)
Location:  Other [facility]) Nursing Home Room Number: 105-A Place of Service:  SNF [phone]) Provider:  Dewayne Shorter, MD  Patient Care Team: Dewayne Shorter, MD as PCP - General (Family Medicine) Lauree Chandler, NP as Nurse Practitioner (Geriatric Medicine)  Extended Emergency Contact Information Primary Emergency Contact: [facility] Address: [place], [place] [address] Johnnette Litter of [place] Phone: [phone] Mobile Phone: [phone] Relation: Son Secondary Emergency Contact: Koffler,[name] Address: [address] [place] [address] Johnnette Litter of [place] Work Phone: [phone] Mobile Phone: [phone] Relation: Son  Code Status:  Full Code  Goals of care: Advanced Directive information    09/23/2022   11:04 AM  Advanced Directives  Does Patient Have a Medical Advance Directive? No  Would patient like information on creating a medical advance directive? No - Patient declined     Chief Complaint  Patient presents with   Acute Visit    Elevated blood pressure. Vitals and medications are a reflection of [facility] EMR system, [facility]      HPI:  Pt is a 88 y.o. female seen today for an acute visit for elevated blood pressure. BP has been above goal since admission. Patient denies chest pain, SOB, headaches, changes in vision, n/v, constipation, diarrhea, changes in urinary patters. She states she feels just fine. She says she was on other BP meds in the past but does not remember them.    Past Medical History:  Diagnosis Date   Anemia    Per [facility] Records   Carpal tunnel syndrome    Per [facility] Records   Difficulty walking    Per [employer] Records   Gait disturbance    Hypertension    Low serum vitamin D    Stage 3b chronic kidney disease ([place])    Tinnitus    Per [facility] Records   Past Surgical History:  Procedure Laterality Date   APPENDECTOMY     BACK SURGERY     CATARACT  EXTRACTION, BILATERAL     CORNEAL TRANSPLANT     TOTAL ABDOMINAL HYSTERECTOMY W/ BILATERAL SALPINGOOPHORECTOMY     TUBAL LIGATION      No Known Allergies  Outpatient Encounter Medications as of 09/23/2022  Medication Sig   lisinopril (ZESTRIL) 40 MG tablet Take 40 mg by mouth daily.   melatonin 5 MG TABS Take 5 mg by mouth at bedtime.   metoprolol succinate (TOPROL-XL) 25 MG 24 hr tablet Take 1 tablet by mouth in the morning and at bedtime.   Wheat Dextrin (BENEFIBER DRINK MIX PO) 2 tsp by mouth two times daily for loose stools   [DISCONTINUED] nystatin cream (MYCOSTATIN) Apply 1 Application topically 2 (two) times daily.   No facility-administered encounter medications on file as of 09/23/2022.    Review of Systems  All other systems reviewed and are negative.   Immunization History  Administered Date(s) Administered   Influenza Split 10/18/2015   Influenza-Unspecified 09/24/2016   Moderna Covid-19 Vaccine Bivalent Booster 65yrs & up 04/12/2021, 08/17/2021   Moderna SARS-COV2 Booster Vaccination 10/10/2020   Moderna Sars-Covid-2 Vaccination 11/30/2019, 01/07/2020   Pneumococcal Conjugate-13 10/30/2016, 12/24/2018   Pneumococcal Polysaccharide-23 11/29/2005   Pneumococcal-Unspecified 11/25/2005   Tdap 10/29/2011, 07/21/2022   Zoster Recombinat (Shingrix) 02/25/2018, 06/15/2018   Zoster, Live 02/23/2006   Pertinent  Health Maintenance Due  Topic Date Due   DEXA SCAN  Never done   INFLUENZA VACCINE  06/25/2022      04/16/2021   12:12 PM 07/21/2022    6:58 PM 07/22/2022    1:12 AM  Fall Risk  Patient Fall Risk Level High fall risk Low fall risk Low fall risk   Functional Status Survey:    Vitals:   09/23/22 1103  BP: (!) 149/64  Pulse: (!) 53  Resp: 18  Temp: (!) 96.5 F (35.8 C)  Weight: 128 lb (58.1 kg)  Height: 5\' 1"  (1.549 m)   Body mass index is 24.19 kg/m. Physical Exam Constitutional:      Appearance: Normal appearance.  Cardiovascular:     Rate  and Rhythm: Normal rate.  Pulmonary:     Effort: Pulmonary effort is normal.     Breath sounds: Normal breath sounds.  Abdominal:     General: Abdomen is flat.     Palpations: Abdomen is soft.  Skin:    General: Skin is warm and dry.     Capillary Refill: Capillary refill takes less than 2 seconds.  Neurological:     Mental Status: She is alert and oriented to person, place, and time.     Labs reviewed: Recent Labs    10/15/21 0000 05/13/22 0000 07/21/22 1909  NA 131* 130* 131*  K 3.8 4.3 3.6  CL  --  96* 96*  CO2  --  26* 25  GLUCOSE  --   --  122*  BUN 1* 13 19  CREATININE  --  1.0 1.01*  CALCIUM  --  8.9 8.6*   No results for input(s): "AST", "ALT", "ALKPHOS", "BILITOT", "PROT", "ALBUMIN" in the last 8760 hours. Recent Labs    10/15/21 0000 07/21/22 1909  WBC  --  6.2  NEUTROABS  --  4.2  HGB 10.9* 11.3*  HCT 32* 34.3*  MCV  --  98.8  PLT  --  257   No results found for: "TSH" No results found for: "HGBA1C" No results found for: "CHOL", "HDL", "LDLCALC", "LDLDIRECT", "TRIG", "CHOLHDL"  Significant Diagnostic Results in last 30 days:  No results found.  Assessment/Plan 1. Essential hypertension Patient asymptomatic with elevated Bps. Continue Lisinopril 40 mg daily. Add amlodipine 2.5 mg. Continue Daily Bps. F/u 2 weeks for BP check.    Family/ staff Communication: nursing  Labs/tests ordered:  none  07/23/22, MD, [facility] [phone]

## 2022-10-15 ENCOUNTER — Encounter: Payer: Self-pay | Admitting: Nurse Practitioner

## 2022-10-15 ENCOUNTER — Non-Acute Institutional Stay (SKILLED_NURSING_FACILITY): Payer: Medicare PPO | Admitting: Nurse Practitioner

## 2022-10-15 DIAGNOSIS — E871 Hypo-osmolality and hyponatremia: Secondary | ICD-10-CM

## 2022-10-15 DIAGNOSIS — R634 Abnormal weight loss: Secondary | ICD-10-CM | POA: Diagnosis not present

## 2022-10-15 DIAGNOSIS — N1832 Chronic kidney disease, stage 3b: Secondary | ICD-10-CM | POA: Diagnosis not present

## 2022-10-15 DIAGNOSIS — F5101 Primary insomnia: Secondary | ICD-10-CM

## 2022-10-15 DIAGNOSIS — R195 Other fecal abnormalities: Secondary | ICD-10-CM | POA: Diagnosis not present

## 2022-10-15 DIAGNOSIS — I10 Essential (primary) hypertension: Secondary | ICD-10-CM

## 2022-10-15 NOTE — Progress Notes (Signed)
Location:  Other [facility]) Nursing Home Room Number: 105-A Place of Service:  SNF [phone]) Provider:  Janene Harvey. Janyth Contes, NP   Patient Care Team: Earnestine Mealing, MD as PCP - General (Family Medicine) Sharon Seller, NP as Nurse Practitioner (Geriatric Medicine)  Extended Emergency Contact Information Primary Emergency Contact: [facility] Address: [address]          [place], [place] [address] Darden Amber of [place] Home Phone: [phone] Mobile Phone: [phone] Relation: Son Secondary Emergency Contact: Hard,[name] Address: [address]          [place], [place] [address] Darden Amber of [place] Work Phone: [phone] Mobile Phone: [phone] Relation: Son  Code Status:  Full Code  Goals of care: Advanced Directive information    10/15/2022   10:59 AM  Advanced Directives  Does Patient Have a Medical Advance Directive? No  Would patient like information on creating a medical advance directive? No - Patient declined     Chief Complaint  Patient presents with   Medical Management of Chronic Issues    Routine visit. Discuss need for DEXA, AWV, and additional covid boosters or post pone if patient is not a candidate or refuses recommendation.     HPI:  Pt is a 88 y.o. female seen today for medical management of chronic diseases.   Pt is now at skill facility at [facility], she was previously in assisted living but plans to move into skilled full time.  She has not had any falls recently Using her walker.   She was previously having loose stool but now getting fiber and having normal BMs  She has a hard time sleeping if she does not get a lot of exercise and walking during the day.   She reports she has a degree in nutrition and knows what she is supposed to eat- reports she has been on a diet her whole life. Appetite is good.   No concerns today.   Past Medical History:  Diagnosis Date   Anemia    Per [facility] Records   Carpal tunnel syndrome     Per [employer] Records   Difficulty walking    Per [employer] Records   Gait disturbance    Hypertension    Low serum vitamin D    Stage 3b chronic kidney disease (HCC)    Tinnitus    Per [facility] Records   Past Surgical History:  Procedure Laterality Date   APPENDECTOMY     BACK SURGERY     CATARACT EXTRACTION, BILATERAL     CORNEAL TRANSPLANT     TOTAL ABDOMINAL HYSTERECTOMY W/ BILATERAL SALPINGOOPHORECTOMY     TUBAL LIGATION      No Known Allergies  Outpatient Encounter Medications as of 10/15/2022  Medication Sig   amLODipine (NORVASC) 2.5 MG tablet Take 1 tablet (2.5 mg total) by mouth daily.   lisinopril (ZESTRIL) 40 MG tablet Take 40 mg by mouth daily.   melatonin 5 MG TABS Take 5 mg by mouth at bedtime.   metoprolol succinate (TOPROL-XL) 25 MG 24 hr tablet Take 1 tablet by mouth in the morning and at bedtime.   Wheat Dextrin (BENEFIBER DRINK MIX PO) 2 tsp by mouth two times daily for loose stools   No facility-administered encounter medications on file as of 10/15/2022.    Review of Systems  Constitutional:  Negative for activity change, appetite change, fatigue and unexpected weight change.  HENT:  Negative for congestion and hearing loss.   Eyes:  Negative.   Respiratory:  Negative for cough and shortness of breath.   Cardiovascular:  Negative for chest pain, palpitations and leg swelling.  Gastrointestinal:  Negative for abdominal pain, constipation and diarrhea.  Genitourinary:  Negative for difficulty urinating and dysuria.  Musculoskeletal:  Negative for arthralgias and myalgias.  Skin:  Negative for color change and wound.  Neurological:  Negative for dizziness and weakness.  Psychiatric/Behavioral:  Positive for sleep disturbance. Negative for agitation, behavioral problems and confusion.     Immunization History  Administered Date(s) Administered   Influenza Split 10/18/2015   Influenza, High Dose Seasonal PF 09/10/2022   Influenza-Unspecified  09/24/2016   Moderna Covid-19 Vaccine Bivalent Booster 109yrs & up 04/12/2021, 08/17/2021   Moderna SARS-COV2 Booster Vaccination 10/10/2020   Moderna Sars-Covid-2 Vaccination 11/30/2019, 01/07/2020   Pneumococcal Conjugate-13 10/30/2016, 12/24/2018   Pneumococcal Polysaccharide-23 11/29/2005   Pneumococcal-Unspecified 11/25/2005   Tdap 10/29/2011, 07/21/2022   Zoster Recombinat (Shingrix) 02/25/2018, 06/15/2018   Zoster, Live 02/23/2006   Pertinent  Health Maintenance Due  Topic Date Due   DEXA SCAN  Never done   INFLUENZA VACCINE  Completed      04/16/2021   12:12 PM 07/21/2022    6:58 PM 07/22/2022    1:12 AM  Fall Risk  Patient Fall Risk Level High fall risk Low fall risk Low fall risk   Functional Status Survey:    Vitals:   10/15/22 1044  BP: 137/68  Pulse: 60  Resp: 16  Temp: (!) 97 F (36.1 C)  Weight: 124 lb 12.8 oz (56.6 kg)  Height: 5\' 1"  (1.549 m)   Body mass index is 23.58 kg/m. Physical Exam Constitutional:      General: She is not in acute distress.    Appearance: She is well-developed. She is not diaphoretic.  HENT:     Head: Normocephalic and atraumatic.     Mouth/Throat:     Pharynx: No oropharyngeal exudate.  Eyes:     Conjunctiva/sclera: Conjunctivae normal.     Pupils: Pupils are equal, round, and reactive to light.  Cardiovascular:     Rate and Rhythm: Normal rate and regular rhythm.     Heart sounds: Normal heart sounds.  Pulmonary:     Effort: Pulmonary effort is normal.     Breath sounds: Normal breath sounds.  Abdominal:     General: Bowel sounds are normal.     Palpations: Abdomen is soft.  Musculoskeletal:     Cervical back: Normal range of motion and neck supple.     Right lower leg: No edema.     Left lower leg: No edema.  Skin:    General: Skin is warm and dry.  Neurological:     Mental Status: She is alert and oriented to person, place, and time. Mental status is at baseline.  Psychiatric:        Mood and Affect: Mood  normal.     Labs reviewed: Recent Labs    05/13/22 0000 07/21/22 1909  NA 130* 131*  K 4.3 3.6  CL 96* 96*  CO2 26* 25  GLUCOSE  --  122*  BUN 13 19  CREATININE 1.0 1.01*  CALCIUM 8.9 8.6*   No results for input(s): "AST", "ALT", "ALKPHOS", "BILITOT", "PROT", "ALBUMIN" in the last 8760 hours. Recent Labs    07/21/22 1909  WBC 6.2  NEUTROABS 4.2  HGB 11.3*  HCT 34.3*  MCV 98.8  PLT 257   No results found for: "TSH" No results found for: "HGBA1C"  No results found for: "CHOL", "HDL", "LDLCALC", "LDLDIRECT", "TRIG", "CHOLHDL"  Significant Diagnostic Results in last 30 days:  No results found.  Assessment/Plan 1. Essential hypertension -Blood pressure well controlled, goal bp <140/90 Continue current medications and dietary modifications follow metabolic panel  2. Stage 3b chronic kidney disease (HCC) -Chronic and stable Encourage proper hydration Follow metabolic panel Avoid nephrotoxic meds (NSAIDS)  3. hyponatremia Stable, will follow   4. Loose stools Resolved on fiber  5. Weight loss -noted to have weight trending down, discussed liberalizing diet and not being restrictive.   6. Primary insomnia Does not sleep in the daytime, continues to be active to promote sleep at night Continues melatonin  Ovidio Steele K. [place], [facility] [phone]

## 2022-11-20 ENCOUNTER — Encounter: Payer: Self-pay | Admitting: Student

## 2022-11-20 ENCOUNTER — Non-Acute Institutional Stay (SKILLED_NURSING_FACILITY): Payer: Medicare PPO | Admitting: Student

## 2022-11-20 DIAGNOSIS — R2689 Other abnormalities of gait and mobility: Secondary | ICD-10-CM

## 2022-11-20 DIAGNOSIS — N1832 Chronic kidney disease, stage 3b: Secondary | ICD-10-CM

## 2022-11-20 DIAGNOSIS — I10 Essential (primary) hypertension: Secondary | ICD-10-CM

## 2022-11-20 DIAGNOSIS — G5601 Carpal tunnel syndrome, right upper limb: Secondary | ICD-10-CM | POA: Diagnosis not present

## 2022-11-20 DIAGNOSIS — E871 Hypo-osmolality and hyponatremia: Secondary | ICD-10-CM

## 2022-11-20 DIAGNOSIS — R29898 Other symptoms and signs involving the musculoskeletal system: Secondary | ICD-10-CM

## 2022-11-20 DIAGNOSIS — R262 Difficulty in walking, not elsewhere classified: Secondary | ICD-10-CM

## 2022-11-20 NOTE — Progress Notes (Signed)
Location:  Other [facility] Room Number: [facility] 205A Place of Service:  SNF [phone]) Provider:  Dr. Sherri Rad, MD  Patient Care Team: Earnestine Mealing, MD as PCP - General (Family Medicine) Sharon Seller, NP as Nurse Practitioner (Geriatric Medicine)  Extended Emergency Contact Information Primary Emergency Contact: [facility] Address: [address]          [place], [place] [address] Darden Amber of [place] Home Phone: [phone] Mobile Phone: [phone] Relation: Son Secondary Emergency Contact: Courtois,[name] Address: [address]          [place], [place] [address] Darden Amber of [place] Work Phone: [phone] Mobile Phone: [phone] Relation: Son  Code Status:  Full Goals of care: Advanced Directive information    11/20/2022   11:15 AM  Advanced Directives  Does Patient Have a Medical Advance Directive? No  Would patient like information on creating a medical advance directive? No - Patient declined     Chief Complaint  Patient presents with   Medical Management of Chronic Issues    Medical Management of Chronic Issues.     HPI:  Pt is a 88 y.o. female seen today for medical management of chronic diseases.  She has enjoyed therapy. They are her friends, please don't stop it. She has no concerns today. Her blood pressure has been improving. She wears a brace on her hand now. Which has kept her fingers straighter than before. She sees the doctor on the 28th for it as well. She has an amarylis in her room with pink blooms that she has enjoyed very much. She says Life is good and she has loved the transition here. They treat me like a queen! She has five children and 10 grandchildren. She loves them all so much and they spent the holiday with her.    Past Medical History:  Diagnosis Date   Anemia    Per [facility] Records   Carpal tunnel syndrome    Per [facility] Records   Difficulty walking    Per [employer]  Records   Gait disturbance    Hypertension    Low serum vitamin D    Stage 3b chronic kidney disease (HCC)    Tinnitus    Per [facility] Records   Past Surgical History:  Procedure Laterality Date   APPENDECTOMY     BACK SURGERY     CATARACT EXTRACTION, BILATERAL     CORNEAL TRANSPLANT     TOTAL ABDOMINAL HYSTERECTOMY W/ BILATERAL SALPINGOOPHORECTOMY     TUBAL LIGATION      No Known Allergies  Outpatient Encounter Medications as of 11/20/2022  Medication Sig   amLODipine (NORVASC) 2.5 MG tablet Take 1 tablet (2.5 mg total) by mouth daily.   lisinopril (ZESTRIL) 40 MG tablet Take 40 mg by mouth daily.   melatonin 5 MG TABS Take 5 mg by mouth at bedtime.   metoprolol succinate (TOPROL-XL) 25 MG 24 hr tablet Take 1 tablet by mouth in the morning and at bedtime.   risperiDONE (RISPERDAL) 0.5 MG tablet Take 0.5 mg by mouth at bedtime.   Wheat Dextrin (BENEFIBER DRINK MIX PO) 2 tsp by mouth two times daily for loose stools   No facility-administered encounter medications on file as of 11/20/2022.    Review of Systems  All other systems reviewed and are negative.   Immunization History  Administered Date(s) Administered   Influenza Split 10/18/2015   Influenza, High Dose Seasonal PF 09/10/2022  Influenza-Unspecified 09/24/2016   Moderna Covid-19 Vaccine Bivalent Booster 3yrs & up 04/12/2021, 08/17/2021   Moderna SARS-COV2 Booster Vaccination 10/10/2020   Moderna Sars-Covid-2 Vaccination 11/30/2019, 01/07/2020   Pneumococcal Conjugate-13 10/30/2016, 12/24/2018   Pneumococcal Polysaccharide-23 11/29/2005   Pneumococcal-Unspecified 11/25/2005   Tdap 10/29/2011, 07/21/2022   Zoster Recombinat (Shingrix) 02/25/2018, 06/15/2018   Zoster, Live 02/23/2006   Pertinent  Health Maintenance Due  Topic Date Due   DEXA SCAN  Never done   INFLUENZA VACCINE  Completed      04/16/2021   12:12 PM 07/21/2022    6:58 PM 07/22/2022    1:12 AM  Fall Risk  Patient Fall Risk Level High  fall risk Low fall risk Low fall risk   Functional Status Survey:    There were no vitals filed for this visit. There is no height or weight on file to calculate BMI. Physical Exam Vitals and nursing note reviewed.  Constitutional:      Appearance: Normal appearance.  Cardiovascular:     Rate and Rhythm: Normal rate and regular rhythm.     Pulses: Normal pulses.     Heart sounds: Normal heart sounds.  Pulmonary:     Effort: Pulmonary effort is normal.     Breath sounds: Normal breath sounds.  Abdominal:     General: Bowel sounds are normal.     Palpations: Abdomen is soft.  Musculoskeletal:     Comments: Right hand in brace to extend first and second finger  Skin:    General: Skin is warm and dry.  Neurological:     Mental Status: She is alert and oriented to person, place, and time.     Labs reviewed: Recent Labs    05/13/22 0000 07/21/22 1909  NA 130* 131*  K 4.3 3.6  CL 96* 96*  CO2 26* 25  GLUCOSE  --  122*  BUN 13 19  CREATININE 1.0 1.01*  CALCIUM 8.9 8.6*   No results for input(s): "AST", "ALT", "ALKPHOS", "BILITOT", "PROT", "ALBUMIN" in the last 8760 hours. Recent Labs    07/21/22 1909  WBC 6.2  NEUTROABS 4.2  HGB 11.3*  HCT 34.3*  MCV 98.8  PLT 257   No results found for: "TSH" No results found for: "HGBA1C" No results found for: "CHOL", "HDL", "LDLCALC", "LDLDIRECT", "TRIG", "CHOLHDL"  Significant Diagnostic Results in last 30 days:  No results found.  Assessment/Plan Essential hypertension  Carpal tunnel syndrome of right wrist  Stage 3b chronic kidney disease (HCC)  Hyponatremia  Difficulty walking  Imbalance  Right arm weakness Patient is doing well without acute concerns. BP well-controlled on current regimen. Continue amlodipine 2.5 mg daily, zestril 40 mg daily. 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 Will continue to monitor for changes. If no  improvement, consider discontinuation. Will assess hyponatremia and CKD with BMP.  Continues to improve with therapy, PT OT to eval and treat as indicated. Carpal tunnel brace, subjectively no improvement. Will continue to assess for need of brace.   Family/ staff Communication: nursing  Labs/tests ordered: BMP and CBC w/ differential  Coralyn Helling, MD, MSBS [facility] [phone]

## 2022-11-21 LAB — CBC: RBC: 3.36 — AB (ref 3.87–5.11)

## 2022-11-21 LAB — BASIC METABOLIC PANEL
BUN: 24 — AB (ref 4–21)
CO2: 29 — AB (ref 13–22)
Chloride: 101 (ref 99–108)
Creatinine: 1 (ref 0.5–1.1)
Glucose: 77
Potassium: 4.2 mEq/L (ref 3.5–5.1)
Sodium: 135 — AB (ref 137–147)

## 2022-11-21 LAB — CBC AND DIFFERENTIAL
HCT: 32 — AB (ref 36–46)
Hemoglobin: 10.6 — AB (ref 12.0–16.0)
Neutrophils Absolute: 2416
Platelets: 247 10*3/uL (ref 150–400)
WBC: 4.7

## 2022-11-21 LAB — COMPREHENSIVE METABOLIC PANEL
Calcium: 8.8 (ref 8.7–10.7)
eGFR: 53

## 2022-12-05 LAB — CBC AND DIFFERENTIAL
HCT: 30 — AB (ref 36–46)
Hemoglobin: 10 — AB (ref 12.0–16.0)
Neutrophils Absolute: 3019
Platelets: 228 10*3/uL (ref 150–400)
WBC: 5.1

## 2022-12-05 LAB — CBC: RBC: 3.13 — AB (ref 3.87–5.11)

## 2022-12-31 ENCOUNTER — Encounter: Payer: Self-pay | Admitting: Nurse Practitioner

## 2022-12-31 ENCOUNTER — Non-Acute Institutional Stay (SKILLED_NURSING_FACILITY): Payer: Medicare PPO | Admitting: Nurse Practitioner

## 2022-12-31 DIAGNOSIS — G5601 Carpal tunnel syndrome, right upper limb: Secondary | ICD-10-CM | POA: Diagnosis not present

## 2022-12-31 DIAGNOSIS — R634 Abnormal weight loss: Secondary | ICD-10-CM

## 2022-12-31 DIAGNOSIS — N1832 Chronic kidney disease, stage 3b: Secondary | ICD-10-CM

## 2022-12-31 DIAGNOSIS — I10 Essential (primary) hypertension: Secondary | ICD-10-CM

## 2022-12-31 DIAGNOSIS — E871 Hypo-osmolality and hyponatremia: Secondary | ICD-10-CM | POA: Diagnosis not present

## 2022-12-31 DIAGNOSIS — D649 Anemia, unspecified: Secondary | ICD-10-CM

## 2022-12-31 DIAGNOSIS — F5101 Primary insomnia: Secondary | ICD-10-CM

## 2022-12-31 NOTE — Progress Notes (Signed)
Location:  Other Nursing Home Room Number: [place] of Service:  SNF (31)  Dewayne Shorter, MD  Patient Care Team: Dewayne Shorter, MD as PCP - General (Family Medicine) Lauree Chandler, NP as Nurse Practitioner (Geriatric Medicine)  Extended Emergency Contact Information Primary Emergency Contact: [facility] Address: [place], [place] [address] Johnnette Litter of [place] Phone: [phone] Mobile Phone: [phone] Relation: Son Secondary Emergency Contact: Grabill,[name] Address: [address] [place] [address] Johnnette Litter of [place] Work Phone: [phone] Mobile Phone: [phone] Relation: Son  Goals of care: Advanced Directive information    12/31/2022    2:40 PM  Advanced Directives  Does Patient Have a Medical Advance Directive? No  Would patient like information on creating a medical advance directive? No - Patient declined     Chief Complaint  Patient presents with   Medical Management of Chronic Issues    Routine follow up    Immunizations    COVID booster due   Quality Metric Gaps    Dexa scan and Medicare annual wellness due    HPI:  Pt is a 89 y.o. female seen today for medical management of chronic disease.  Reports she is doing well.  Never has a pain. Having her 89th birthday party this weekend.   Seeing neurology for hallucinations she was placed on risperdal which she reports did not do much for them but makes her hungry so dose was decreased.    She is followed by orthopedic for her wrist, has appt tomorrow. Reports she can not use her right hand at this time. Unable to grib/grab. She was right hand dominate and now having to use her left hand for everything.  Staff at facility also helps.   Moving bowel well. Uses benfiber which helps.   She sleeps well when she is active. Tries to maintain this.   No anxiety or depression.   States she is loving being here, reports her children say you are  not supposed to love being in a nursing home but she loves it.      Past Medical History:  Diagnosis Date   Anemia    Per [facility] Records   Carpal tunnel syndrome    Per [facility] Records   Difficulty walking    Per [employer] Records   Gait disturbance    Hypertension    Low serum vitamin D    Stage 3b chronic kidney disease ([place])    Tinnitus    Per [facility] Records   Past Surgical History:  Procedure Laterality Date   APPENDECTOMY     BACK SURGERY     CATARACT EXTRACTION, BILATERAL     CORNEAL TRANSPLANT     TOTAL ABDOMINAL HYSTERECTOMY W/ BILATERAL SALPINGOOPHORECTOMY     TUBAL LIGATION      No Known Allergies  Outpatient Encounter Medications as of 12/31/2022  Medication Sig   amLODipine (NORVASC) 2.5 MG tablet Take 1 tablet (2.5 mg total) by mouth daily.   lisinopril (ZESTRIL) 40 MG tablet Take 40 mg by mouth daily.   melatonin 5 MG TABS Take 5 mg by mouth at bedtime.   metoprolol succinate (TOPROL-XL) 25 MG 24 hr tablet Take 1 tablet by mouth in the morning and at bedtime.   Wheat Dextrin (BENEFIBER DRINK MIX PO) 2 tsp by mouth two times daily for loose stools   [DISCONTINUED] risperiDONE (  RISPERDAL) 0.5 MG tablet Take 0.5 mg by mouth at bedtime.   No facility-administered encounter medications on file as of 12/31/2022.    Review of Systems  Constitutional:  Negative for activity change, appetite change, fatigue and unexpected weight change.  HENT:  Negative for congestion and hearing loss.   Eyes: Negative.   Respiratory:  Negative for cough and shortness of breath.   Cardiovascular:  Negative for chest pain, palpitations and leg swelling.  Gastrointestinal:  Negative for abdominal pain, constipation and diarrhea.  Genitourinary:  Negative for difficulty urinating and dysuria.  Musculoskeletal:  Negative for arthralgias and myalgias.  Skin:  Negative for color change and wound.  Neurological:  Negative for dizziness and weakness.   Psychiatric/Behavioral:  Negative for agitation, behavioral problems and confusion.      Immunization History  Administered Date(s) Administered   Influenza Split 10/18/2015   Influenza, High Dose Seasonal PF 09/10/2022   Influenza-Unspecified 09/24/2016   Moderna Covid-19 Vaccine Bivalent Booster 14yrs & up 04/12/2021, 08/17/2021   Moderna SARS-COV2 Booster Vaccination 10/10/2020   Moderna Sars-Covid-2 Vaccination 11/30/2019, 01/07/2020   Pneumococcal Conjugate-13 10/30/2016, 12/24/2018   Pneumococcal Polysaccharide-23 11/29/2005   Pneumococcal-Unspecified 11/25/2005   Tdap 10/29/2011, 07/21/2022   Zoster Recombinat (Shingrix) 02/25/2018, 06/15/2018   Zoster, Live 02/23/2006   Pertinent  Health Maintenance Due  Topic Date Due   DEXA SCAN  Never done   INFLUENZA VACCINE  Completed      04/16/2021   12:12 PM 07/21/2022    6:58 PM 07/22/2022    1:12 AM  Fall Risk  (RETIRED) Patient Fall Risk Level High fall risk Low fall risk Low fall risk   Functional Status Survey:    Vitals:   12/31/22 1438  BP: 130/70  Pulse: 64  Resp: 18  Temp: 97.8 F (36.6 C)  SpO2: 96%  Weight: 130 lb 9.6 oz (59.2 kg)  Height: 5\' 1"  (1.549 m)   Body mass index is 24.68 kg/m. Physical Exam Constitutional:      General: She is not in acute distress.    Appearance: She is well-developed. She is not diaphoretic.  HENT:     Head: Normocephalic and atraumatic.     Mouth/Throat:     Pharynx: No oropharyngeal exudate.  Eyes:     Conjunctiva/sclera: Conjunctivae normal.     Pupils: Pupils are equal, round, and reactive to light.  Cardiovascular:     Rate and Rhythm: Normal rate and regular rhythm.     Heart sounds: Normal heart sounds.  Pulmonary:     Effort: Pulmonary effort is normal.     Breath sounds: Normal breath sounds.  Abdominal:     General: Bowel sounds are normal.     Palpations: Abdomen is soft.  Musculoskeletal:     Cervical back: Normal range of motion and neck supple.      Right lower leg: No edema.     Left lower leg: No edema.     Comments: Contractures to right hand with limited movement.   Skin:    General: Skin is warm and dry.  Neurological:     Mental Status: She is alert.  Psychiatric:        Mood and Affect: Mood normal.     Labs reviewed: Recent Labs    05/13/22 0000 07/21/22 1909 11/21/22 0000  NA 130* 131* 135*  K 4.3 3.6 4.2  CL 96* 96* 101  CO2 26* 25 29*  GLUCOSE  --  122*  --   BUN  13 19 24*  CREATININE 1.0 1.01* 1.0  CALCIUM 8.9 8.6* 8.8   No results for input(s): "AST", "ALT", "ALKPHOS", "BILITOT", "PROT", "ALBUMIN" in the last 8760 hours. Recent Labs    07/21/22 1909 11/21/22 0000 12/05/22 0000  WBC 6.2 4.7 5.1  NEUTROABS 4.2 2,416.00 3,019.00  HGB 11.3* 10.6* 10.0*  HCT 34.3* 32* 30*  MCV 98.8  --   --   PLT 257 247 228   No results found for: "TSH" No results found for: "HGBA1C" No results found for: "CHOL", "HDL", "LDLCALC", "LDLDIRECT", "TRIG", "CHOLHDL"  Significant Diagnostic Results in last 30 days:  No results found.  Assessment/Plan 1. Essential hypertension -Blood pressure well controlled, goal bp <140/90 Continue current medications and dietary modifications follow metabolic panel  2. Stage 3b chronic kidney disease (HCC) -Chronic and stable Encourage proper hydration Follow metabolic panel Avoid nephrotoxic meds (NSAIDS)  3. Hyponatremia -improved on recent labs  4. Carpal tunnel syndrome of right wrist Stable, without worsening pain. Followed by orthopedics   5. Primary insomnia -controlled, encouraged to continue routine exercise to help with sleep.   6. Weight loss -has had positive gain and weight remains stable.   7. Anemia, unspecified type -stable, will continue to monitor.    Carlos American. [place], [facility] [phone]

## 2023-01-27 LAB — BASIC METABOLIC PANEL
BUN: 21 (ref 4–21)
CO2: 30 — AB (ref 13–22)
Chloride: 100 (ref 99–108)
Creatinine: 0.9 (ref 0.5–1.1)
Glucose: 91
Potassium: 4.2 mEq/L (ref 3.5–5.1)
Sodium: 137 (ref 137–147)

## 2023-01-27 LAB — COMPREHENSIVE METABOLIC PANEL
Calcium: 9 (ref 8.7–10.7)
eGFR: 60

## 2023-01-27 LAB — CBC AND DIFFERENTIAL
HCT: 35 — AB (ref 36–46)
Hemoglobin: 12.1 (ref 12.0–16.0)
Neutrophils Absolute: 2803
Platelets: 259 10*3/uL (ref 150–400)
WBC: 4.8

## 2023-01-27 LAB — CBC: RBC: 3.71 — AB (ref 3.87–5.11)

## 2023-03-14 ENCOUNTER — Encounter: Payer: Self-pay | Admitting: Student

## 2023-03-14 ENCOUNTER — Non-Acute Institutional Stay (SKILLED_NURSING_FACILITY): Payer: Medicare PPO | Admitting: Student

## 2023-03-14 DIAGNOSIS — N1832 Chronic kidney disease, stage 3b: Secondary | ICD-10-CM

## 2023-03-14 DIAGNOSIS — I10 Essential (primary) hypertension: Secondary | ICD-10-CM | POA: Diagnosis not present

## 2023-03-14 DIAGNOSIS — G5601 Carpal tunnel syndrome, right upper limb: Secondary | ICD-10-CM | POA: Diagnosis not present

## 2023-03-14 DIAGNOSIS — R440 Auditory hallucinations: Secondary | ICD-10-CM

## 2023-03-14 NOTE — Progress Notes (Incomplete)
Location:  Other [facility] Room Number: [facility] [address]A Place of Service:  SNF [phone]) Provider:  Earnestine Mealing, MD  Patient Care Team: Earnestine Mealing, MD as PCP - General (Family Medicine) Sharon Seller, NP as Nurse Practitioner (Geriatric Medicine)  Extended Emergency Contact Information Primary Emergency Contact: [facility] Address: [address]          [place], [place] [address] Darden Amber of [place] Home Phone: [phone] Mobile Phone: [phone] Relation: Son Secondary Emergency Contact: Round,[name] Address: [address]          [place], [place] [address] Darden Amber of [place] Work Phone: [phone] Mobile Phone: [phone] Relation: Son  Code Status:  Full Code Goals of care: Advanced Directive information    03/14/2023    9:47 AM  Advanced Directives  Does Patient Have a Medical Advance Directive? No  Would patient like information on creating a medical advance directive? No - Patient declined     Chief Complaint  Patient presents with  . Medical Management of Chronic Issues    Medical Management of Chronic Issues.     HPI:  Pt is a 89 y.o. female seen today for medical management of chronic diseases.    Sh ewants to walk to the gym She feels like she needs to exercise more. She has a brace for the hand, but it doesn't hurt much. They are working on something that has a grip and a brace to keep her hand more   Nursing has concerns about her hand.   Past Medical History:  Diagnosis Date  . Anemia    Per [facility]  . Carpal tunnel syndrome    Per [facility]  . Difficulty walking    Per [facility]  . Gait disturbance   . Hypertension   . Low serum vitamin D   . Stage 3b chronic kidney disease   . Tinnitus    Per [facility] Records   Past Surgical History:  Procedure Laterality Date  . APPENDECTOMY    . BACK SURGERY    . CATARACT EXTRACTION, BILATERAL    . CORNEAL TRANSPLANT    . TOTAL  ABDOMINAL HYSTERECTOMY W/ BILATERAL SALPINGOOPHORECTOMY    . TUBAL LIGATION      No Known Allergies  Outpatient Encounter Medications as of 03/14/2023  Medication Sig  . amLODipine (NORVASC) 2.5 MG tablet Take 1 tablet (2.5 mg total) by mouth daily.  Marland Kitchen lisinopril (ZESTRIL) 40 MG tablet Take 40 mg by mouth daily.  . melatonin 5 MG TABS Take 5 mg by mouth at bedtime.  . metoprolol succinate (TOPROL-XL) 25 MG 24 hr tablet Take 1 tablet by mouth in the morning and at bedtime.  . Wheat Dextrin (BENEFIBER DRINK MIX PO) 2 tsp by mouth two times daily for loose stools   No facility-administered encounter medications on file as of 03/14/2023.    Review of Systems  Immunization History  Administered Date(s) Administered  . Covid-19, Mrna,Vaccine(Spikevax)86yrs and older 03/04/2023  . Influenza Split 10/18/2015  . Influenza, High Dose Seasonal PF 09/10/2022  . Influenza-Unspecified 09/24/2016  . Moderna Covid-19 Vaccine Bivalent Booster 57yrs & up 04/12/2021, 08/17/2021  . Moderna SARS-COV2 Booster Vaccination 10/10/2020  . Moderna Sars-Covid-2 Vaccination 11/30/2019, 01/07/2020  . Pneumococcal Conjugate-13 10/30/2016, 12/24/2018  . Pneumococcal Polysaccharide-23 11/29/2005  . Pneumococcal-Unspecified 11/25/2005  . Tdap 10/29/2011, 07/21/2022  . Zoster Recombinat (Shingrix) 02/25/2018, 06/15/2018  . Zoster, Live 02/23/2006   Pertinent  Health Maintenance Due  Topic Date  Due  . DEXA SCAN  Never done  . INFLUENZA VACCINE  06/26/2023      04/16/2021   12:12 PM 07/21/2022    6:58 PM 07/22/2022    1:12 AM  Fall Risk  (RETIRED) Patient Fall Risk Level High fall risk Low fall risk Low fall risk   Functional Status Survey:    Vitals:   03/14/23 0941 03/14/23 0952  BP: (!) 167/80 133/64  Pulse: 71   Resp: 18   Temp: 97.6 F (36.4 C)   SpO2: 100%   Weight: 131 lb 14.4 oz (59.8 kg)   Height:  (1.549 m)    Body mass index is 24.92 kg/m. Physical Exam  Labs reviewed: Recent  Labs    07/21/22 1909 11/21/22 0000 01/27/23 0000  NA 131* 135* 137  K 3.6 4.2 4.2  CL 96* 101 100  CO2 25 29* 30*  GLUCOSE 122*  --   --   BUN 19 24* 21  CREATININE 1.01* 1.0 0.9  CALCIUM 8.6* 8.8 9.0   No results for input(s): "AST", "ALT", "ALKPHOS", "BILITOT", "PROT", "ALBUMIN" in the last 8760 hours. Recent Labs    07/21/22 1909 11/21/22 0000 12/05/22 0000 01/27/23 0000  WBC 6.2 4.7 5.1 4.8  NEUTROABS 4.2 2,416.00 3,019.00 2,803.00  HGB 11.3* 10.6* 10.0* 12.1  HCT 34.3* 32* 30* 35*  MCV 98.8  --   --   --   PLT 257 247 228 259   No results found for: "TSH" No results found for: "HGBA1C" No results found for: "CHOL", "HDL", "LDLCALC", "LDLDIRECT", "TRIG", "CHOLHDL"  Significant Diagnostic Results in last 30 days:  No results found.  Assessment/Plan There are no diagnoses linked to this encounter.   Family/ staff Communication: ***  Labs/tests ordered:  ***

## 2023-03-14 NOTE — Progress Notes (Unsigned)
Location:  Other [facility] Room Number: [facility] [address] Place of Service:  SNF [phone]) Provider:  Earnestine Mealing, MD  Patient Care Team: Earnestine Mealing, MD as PCP - General (Family Medicine) Sharon Seller, NP as Nurse Practitioner (Geriatric Medicine)  Extended Emergency Contact Information Primary Emergency Contact: [facility] Address: [address]          [place], [place] [address] Darden Amber of [place] Home Phone: [phone] Mobile Phone: [phone] Relation: Son Secondary Emergency Contact: Dray,[name] Address: [address]          [place], [place] [address] Darden Amber of [place] Work Phone: [phone] Mobile Phone: [phone] Relation: Son  Code Status:  Full Code Goals of care: Advanced Directive information    03/14/2023    9:47 AM  Advanced Directives  Does Patient Have a Medical Advance Directive? No  Would patient like information on creating a medical advance directive? No - Patient declined     Chief Complaint  Patient presents with   Medical Management of Chronic Issues    Medical Management of Chronic Issues.     HPI:  Pt is a 89 y.o. female seen today for medical management of chronic diseases.    Sh ewants to walk to the gym She feels like she needs to exercise more. She has a brace for the hand, but it doesn't hurt much. They are working on something that has a grip and a brace to keep her hand more    Nursing has concerns about her hand.   Past Medical History:  Diagnosis Date   Anemia    Per [facility] Records   Carpal tunnel syndrome    Per [facility] Records   Difficulty walking    Per [facility] Records   Gait disturbance    Hypertension    Low serum vitamin D    Stage 3b chronic kidney disease    Tinnitus    Per [facility] Records   Past Surgical History:  Procedure Laterality Date   APPENDECTOMY     BACK SURGERY     CATARACT EXTRACTION, BILATERAL     CORNEAL TRANSPLANT     TOTAL ABDOMINAL  HYSTERECTOMY W/ BILATERAL SALPINGOOPHORECTOMY     TUBAL LIGATION      No Known Allergies  Outpatient Encounter Medications as of 03/14/2023  Medication Sig   amLODipine (NORVASC) 2.5 MG tablet Take 1 tablet (2.5 mg total) by mouth daily.   lisinopril (ZESTRIL) 40 MG tablet Take 40 mg by mouth daily.   melatonin 5 MG TABS Take 5 mg by mouth at bedtime.   metoprolol succinate (TOPROL-XL) 25 MG 24 hr tablet Take 1 tablet by mouth in the morning and at bedtime.   Wheat Dextrin (BENEFIBER DRINK MIX PO) 2 tsp by mouth two times daily for loose stools   No facility-administered encounter medications on file as of 03/14/2023.    Review of Systems  Immunization History  Administered Date(s) Administered   Covid-19, Mrna,Vaccine(Spikevax)53yrs and older 03/04/2023   Influenza Split 10/18/2015   Influenza, High Dose Seasonal PF 09/10/2022   Influenza-Unspecified 09/24/2016   Moderna Covid-19 Vaccine Bivalent Booster 22yrs & up 04/12/2021, 08/17/2021   Moderna SARS-COV2 Booster Vaccination 10/10/2020   Moderna Sars-Covid-2 Vaccination 11/30/2019, 01/07/2020   Pneumococcal Conjugate-13 10/30/2016, 12/24/2018   Pneumococcal Polysaccharide-23 11/29/2005   Pneumococcal-Unspecified 11/25/2005   Tdap 10/29/2011, 07/21/2022   Zoster Recombinat (Shingrix) 02/25/2018, 06/15/2018   Zoster, Live 02/23/2006   Pertinent  Health Maintenance Due  Topic  Date Due   DEXA SCAN  Never done   INFLUENZA VACCINE  06/26/2023      04/16/2021   12:12 PM 07/21/2022    6:58 PM 07/22/2022    1:12 AM  Fall Risk  (RETIRED) Patient Fall Risk Level High fall risk Low fall risk Low fall risk   Functional Status Survey:    Vitals:   03/14/23 0941 03/14/23 0952  BP: (!) 167/80 133/64  Pulse: 71   Resp: 18   Temp: 97.6 F (36.4 C)   SpO2: 100%   Weight: 131 lb 14.4 oz (59.8 kg)   Height:  (1.549 m)    Body mass index is 24.92 kg/m. Physical Exam Cardiovascular:     Rate and Rhythm: Normal rate.      Pulses: Normal pulses.  Pulmonary:     Effort: Pulmonary effort is normal.  Musculoskeletal:     Comments: Right hand with contracture, improved with brace  Neurological:     Mental Status: She is alert and oriented to person, place, and time.     Labs reviewed: Recent Labs    07/21/22 1909 11/21/22 0000 01/27/23 0000  NA 131* 135* 137  K 3.6 4.2 4.2  CL 96* 101 100  CO2 25 29* 30*  GLUCOSE 122*  --   --   BUN 19 24* 21  CREATININE 1.01* 1.0 0.9  CALCIUM 8.6* 8.8 9.0   No results for input(s): "AST", "ALT", "ALKPHOS", "BILITOT", "PROT", "ALBUMIN" in the last 8760 hours. Recent Labs    07/21/22 1909 11/21/22 0000 12/05/22 0000 01/27/23 0000  WBC 6.2 4.7 5.1 4.8  NEUTROABS 4.2 2,416.00 3,019.00 2,803.00  HGB 11.3* 10.6* 10.0* 12.1  HCT 34.3* 32* 30* 35*  MCV 98.8  --   --   --   PLT 257 247 228 259   No results found for: "TSH" No results found for: "HGBA1C" No results found for: "CHOL", "HDL", "LDLCALC", "LDLDIRECT", "TRIG", "CHOLHDL"  Significant Diagnostic Results in last 30 days:  No results found.  Assessment/Plan Essential hypertension  Stage 3b chronic kidney disease  Carpal tunnel syndrome of right wrist  Auditory hallucinations Patient's right hand without improvement with brace. Has a follow up appointment with orthopedics for her right shoulder which could be impacting her right hand strength. Nursing to schedule f/u. BP well-controlled on current regimen. Continues to have auditory hallucinations with music, however, prefers no medications. Quarterly BMP for monitoring kidney function.    Family/ staff Communication: nursing  Labs/tests ordered:  quarterly bmp

## 2023-04-28 LAB — CBC AND DIFFERENTIAL
HCT: 33 — AB (ref 36–46)
Hemoglobin: 11.1 — AB (ref 12.0–16.0)
Neutrophils Absolute: 2669
Platelets: 276 10*3/uL (ref 150–400)
WBC: 4.5

## 2023-04-28 LAB — BASIC METABOLIC PANEL
BUN: 17 (ref 4–21)
CO2: 29 — AB (ref 13–22)
Chloride: 99 (ref 99–108)
Creatinine: 0.8 (ref 0.5–1.1)
Glucose: 83
Potassium: 4.1 mEq/L (ref 3.5–5.1)
Sodium: 134 — AB (ref 137–147)

## 2023-04-28 LAB — COMPREHENSIVE METABOLIC PANEL
Calcium: 8.3 — AB (ref 8.7–10.7)
eGFR: 67

## 2023-04-28 LAB — CBC: RBC: 3.43 — AB (ref 3.87–5.11)

## 2023-04-29 ENCOUNTER — Non-Acute Institutional Stay (SKILLED_NURSING_FACILITY): Payer: Medicare PPO | Admitting: Nurse Practitioner

## 2023-04-29 ENCOUNTER — Encounter: Payer: Self-pay | Admitting: Nurse Practitioner

## 2023-04-29 DIAGNOSIS — D649 Anemia, unspecified: Secondary | ICD-10-CM

## 2023-04-29 DIAGNOSIS — E871 Hypo-osmolality and hyponatremia: Secondary | ICD-10-CM

## 2023-04-29 DIAGNOSIS — I10 Essential (primary) hypertension: Secondary | ICD-10-CM | POA: Diagnosis not present

## 2023-04-29 DIAGNOSIS — J3489 Other specified disorders of nose and nasal sinuses: Secondary | ICD-10-CM

## 2023-04-29 DIAGNOSIS — N1832 Chronic kidney disease, stage 3b: Secondary | ICD-10-CM | POA: Diagnosis not present

## 2023-04-29 DIAGNOSIS — R634 Abnormal weight loss: Secondary | ICD-10-CM

## 2023-04-29 NOTE — Progress Notes (Unsigned)
Location:  Other [facility]  [facility]ing Home Room Number: [facility] [address]A Place of Service:  SNF (31) Abbey Chatters, NP  PCP: Earnestine Mealing, MD  Patient Care Team: Earnestine Mealing, MD as PCP - General (Family Medicine) Sharon Seller, NP as Nurse Practitioner (Geriatric Medicine)  Extended Emergency Contact Information Primary Emergency Contact: [facility] Address: [address]          [place], [place] [address] Darden Amber of [place] Home Phone: [phone] Mobile Phone: [phone] Relation: Son Secondary Emergency Contact: Rumple,[name] Address: [address]          [place], [place] [address] Darden Amber of [place] Work Phone: [phone] Mobile Phone: [phone] Relation: Son  Goals of care: Advanced Directive information    04/29/2023    2:29 PM  Advanced Directives  Does Patient Have a Medical Advance Directive? No  Would patient like information on creating a medical advance directive? No - Patient declined     Chief Complaint  Patient presents with   Medical Management of Chronic Issues    Medical Management of Chronic Issues.     HPI:  Pt is a 89 y.o. female seen today for medical management of chronic disease. Pt with hx of anemia, htn, CKD, weight loss.  She has had positive weight gain in the last few months.  Nursing does not have any concerns at this time.   Pt has lesion on her nose and has requested to see in house dermatololy  She reports she is doing well. Exercising routinely and working with OT with her right hand.    Past Medical History:  Diagnosis Date   Anemia    Per [facility] Records   Carpal tunnel syndrome    Per [facility] Records   Difficulty walking    Per [facility] Records   Gait disturbance    Hypertension    Low serum vitamin D    Stage 3b chronic kidney disease (HCC)    Tinnitus    Per [facility] Records   Past Surgical History:  Procedure Laterality Date   APPENDECTOMY     BACK SURGERY     CATARACT  EXTRACTION, BILATERAL     CORNEAL TRANSPLANT     TOTAL ABDOMINAL HYSTERECTOMY W/ BILATERAL SALPINGOOPHORECTOMY     TUBAL LIGATION      No Known Allergies  Outpatient Encounter Medications as of 04/29/2023  Medication Sig   amLODipine (NORVASC) 5 MG tablet Take 5 mg by mouth daily.   lisinopril (ZESTRIL) 40 MG tablet Take 40 mg by mouth daily.   melatonin 5 MG TABS Take 5 mg by mouth at bedtime.   metoprolol succinate (TOPROL-XL) 25 MG 24 hr tablet Take 1 tablet by mouth in the morning and at bedtime.   Wheat Dextrin (BENEFIBER DRINK MIX PO) 2 tsp by mouth two times daily for loose stools   [DISCONTINUED] amLODipine (NORVASC) 2.5 MG tablet Take 1 tablet (2.5 mg total) by mouth daily.   No facility-administered encounter medications on file as of 04/29/2023.    Review of Systems  Constitutional:  Negative for activity change, appetite change, fatigue and unexpected weight change.  HENT:  Negative for congestion and hearing loss.   Eyes: Negative.   Respiratory:  Negative for cough and shortness of breath.   Cardiovascular:  Negative for chest pain, palpitations and leg swelling.  Gastrointestinal:  Negative for abdominal pain, constipation and diarrhea.  Genitourinary:  Negative for difficulty urinating and dysuria.  Musculoskeletal:  Negative for arthralgias  and myalgias.  Skin:  Negative for color change and wound.  Neurological:  Negative for dizziness and weakness.  Psychiatric/Behavioral:  Negative for agitation, behavioral problems and confusion.      Immunization History  Administered Date(s) Administered   Covid-19, Mrna,Vaccine(Spikevax)43yrs and older 03/04/2023   Influenza Split 10/18/2015   Influenza, High Dose Seasonal PF 09/10/2022   Influenza-Unspecified 09/24/2016   Moderna Covid-19 Vaccine Bivalent Booster 67yrs & up 04/12/2021, 08/17/2021   Moderna SARS-COV2 Booster Vaccination 10/10/2020   Moderna Sars-Covid-2 Vaccination 11/30/2019, 01/07/2020   Pneumococcal  Conjugate-13 10/30/2016, 12/24/2018   Pneumococcal Polysaccharide-23 11/29/2005   Pneumococcal-Unspecified 11/25/2005   Tdap 10/29/2011, 07/21/2022   Zoster Recombinat (Shingrix) 02/25/2018, 06/15/2018   Zoster, Live 02/23/2006   Pertinent  Health Maintenance Due  Topic Date Due   DEXA SCAN  Never done   INFLUENZA VACCINE  06/26/2023      04/16/2021   12:12 PM 07/21/2022    6:58 PM 07/22/2022    1:12 AM  Fall Risk  (RETIRED) Patient Fall Risk Level High fall risk Low fall risk Low fall risk   Functional Status Survey:    Vitals:   04/29/23 1423 04/29/23 1433  BP: (!) 142/79 138/76  Pulse: 67   Resp: 20   Temp: 97.6 F (36.4 C)   SpO2: 94%   Weight: 133 lb 11.2 oz (60.6 kg)   Height: 5\' 1"  (1.549 m)    Body mass index is 25.26 kg/m. Physical Exam Constitutional:      General: She is not in acute distress.    Appearance: She is well-developed. She is not diaphoretic.  HENT:     Head: Normocephalic and atraumatic.     Mouth/Throat:     Pharynx: No oropharyngeal exudate.  Eyes:     Conjunctiva/sclera: Conjunctivae normal.     Pupils: Pupils are equal, round, and reactive to light.  Cardiovascular:     Rate and Rhythm: Normal rate and regular rhythm.     Heart sounds: Normal heart sounds.  Pulmonary:     Effort: Pulmonary effort is normal.     Breath sounds: Normal breath sounds.  Abdominal:     General: Bowel sounds are normal.     Palpations: Abdomen is soft.  Musculoskeletal:     Cervical back: Normal range of motion and neck supple.     Right lower leg: No edema.     Left lower leg: No edema.  Skin:    General: Skin is warm and dry.  Neurological:     Mental Status: She is alert.  Psychiatric:        Mood and Affect: Mood normal.     Labs reviewed: Recent Labs    07/21/22 1909 11/21/22 0000 01/27/23 0000 04/28/23 0000  NA 131* 135* 137 134*  K 3.6 4.2 4.2 4.1  CL 96* 101 100 99  CO2 25 29* 30* 29*  GLUCOSE 122*  --   --   --   BUN 19 24* 21  17  CREATININE 1.01* 1.0 0.9 0.8  CALCIUM 8.6* 8.8 9.0 8.3*   No results for input(s): "AST", "ALT", "ALKPHOS", "BILITOT", "PROT", "ALBUMIN" in the last 8760 hours. Recent Labs    07/21/22 1909 11/21/22 0000 12/05/22 0000 01/27/23 0000 04/28/23 0000  WBC 6.2   < > 5.1 4.8 4.5  NEUTROABS 4.2   < > 3,019.00 2,803.00 2,669.00  HGB 11.3*   < > 10.0* 12.1 11.1*  HCT 34.3*   < > 30* 35* 33*  MCV  98.8  --   --   --   --   PLT 257   < > 228 259 276   < > = values in this interval not displayed.   No results found for: "TSH" No results found for: "HGBA1C" No results found for: "CHOL", "HDL", "LDLCALC", "LDLDIRECT", "TRIG", "CHOLHDL"  Significant Diagnostic Results in last 30 days:  No results found.  Assessment/Plan 1. Essential hypertension -blood pressure elevated on multiple occasions, will increase norvasc to 5 mg daily, continue lisinopril and metoprolol.   2. Stage 3b chronic kidney disease (HCC) -Chronic and stable Encourage proper hydration Follow metabolic panel Avoid nephrotoxic meds (NSAIDS)  3. Hyponatremia -improved on recheck lab, will continue to monitor  4. Anemia, unspecified type Stable and hgb improved on last lab, continue to montior  5. Weight loss Improved recently with positive weight gain, cont to encouraged to liberalize diet. To have protein supplement in addition to smallest meal of the day.  6. Lesion of nose Request for dermatology evaluation.    Janene Harvey. [facility] [phone]

## 2023-06-05 ENCOUNTER — Encounter: Payer: Self-pay | Admitting: Nurse Practitioner

## 2023-06-05 ENCOUNTER — Non-Acute Institutional Stay (INDEPENDENT_AMBULATORY_CARE_PROVIDER_SITE_OTHER): Payer: Medicare PPO | Admitting: Nurse Practitioner

## 2023-06-05 DIAGNOSIS — Z0000 Encounter for general adult medical examination without abnormal findings: Secondary | ICD-10-CM

## 2023-06-05 NOTE — Patient Instructions (Signed)
  Brittany Bryant , Thank you for taking time to come for your Medicare Wellness Visit. I appreciate your ongoing commitment to your health goals. Please review the following plan we discussed and let me know if I can assist you in the future.   These are the goals we discussed:  Goals   None     This is a list of the screening recommended for you and due dates:  Health Maintenance  Topic Date Due   DEXA scan (bone density measurement)  Never done   Flu Shot  06/26/2023   COVID-19 Vaccine (7 - 2023-24 season) 07/04/2023   Medicare Annual Wellness Visit  06/04/2024   DTaP/Tdap/Td vaccine (3 - Td or Tdap) 07/21/2032   Pneumonia Vaccine  Completed   Zoster (Shingles) Vaccine  Completed   HPV Vaccine  Aged Out

## 2023-06-05 NOTE — Progress Notes (Signed)
Subjective:   Brittany Bryant is a 89 y.o. female who presents for Medicare Annual (Subsequent) preventive examination.  Visit Complete: In person at [facility]   Patient Medicare AWV questionnaire was completed by the patient on 7/11; I have confirmed that all information answered by patient is correct and no changes since this date.  Review of Systems     Cardiac Risk Factors include: sedentary lifestyle;advanced age (>11men, >66 women);hypertension     Objective:    Today's Vitals   06/05/23 1502  BP: 131/73  Pulse: (!) 57  Resp: 20  Temp: (!) 97.5 F (36.4 C)  SpO2: 96%  Weight: 132 lb 9.6 oz (60.1 kg)  Height: 5\' 1"  (1.549 m)   Body mass index is 25.05 kg/m.     06/05/2023    3:06 PM 04/29/2023    2:29 PM 03/14/2023    9:47 AM 12/31/2022    2:40 PM 11/20/2022   11:15 AM 10/15/2022   10:59 AM 09/23/2022   11:04 AM  Advanced Directives  Does Patient Have a Medical Advance Directive? No No No No No No No  Would patient like information on creating a medical advance directive? No - Patient declined No - Patient declined No - Patient declined No - Patient declined No - Patient declined No - Patient declined No - Patient declined    Current Medications (verified) Outpatient Encounter Medications as of 06/05/2023  Medication Sig   amLODipine (NORVASC) 5 MG tablet Take 5 mg by mouth daily.   lisinopril (ZESTRIL) 40 MG tablet Take 40 mg by mouth daily.   melatonin 5 MG TABS Take 5 mg by mouth at bedtime.   metoprolol succinate (TOPROL-XL) 25 MG 24 hr tablet Take 1 tablet by mouth in the morning and at bedtime.   Wheat Dextrin (BENEFIBER DRINK MIX PO) 2 tsp by mouth two times daily for loose stools   No facility-administered encounter medications on file as of 06/05/2023.    Allergies (verified) Patient has no known allergies.   History: Past Medical History:  Diagnosis Date   Anemia    Per [facility] Records   Carpal tunnel syndrome    Per [facility] Records    Difficulty walking    Per [facility] Records   Gait disturbance    Hypertension    Low serum vitamin D    Stage 3b chronic kidney disease (HCC)    Tinnitus    Per [facility] Records   Past Surgical History:  Procedure Laterality Date   APPENDECTOMY     BACK SURGERY     CATARACT EXTRACTION, BILATERAL     CORNEAL TRANSPLANT     TOTAL ABDOMINAL HYSTERECTOMY W/ BILATERAL SALPINGOOPHORECTOMY     TUBAL LIGATION     Family History  Problem Relation Age of Onset   Arthritis Father    COPD Father    Social History   Socioeconomic History   Marital status: Widowed    Spouse name: Not on file   Number of children: Not on file   Years of education: Not on file   Highest education level: Not on file  Occupational History   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  Tobacco Use   Smoking status: Never   Smokeless tobacco: Never  Vaping Use   Vaping status: Never Used  Substance and Sexual Activity   Alcohol use: Yes    Comment: rare wine   Drug use: Not Currently   Sexual activity:  Not on file  Other Topics Concern   Not on file  Social History Narrative   Widowed ~2012   5 children      Has living will   Son Onalee Hua is health care POA   Would accept resuscitation attempts   No feeding tube if cognitively unaware   Social Determinants of Health   Financial Resource Strain: Not on file  Food Insecurity: Not on file  Transportation Needs: Not on file  Physical Activity: Not on file  Stress: Not on file  Social Connections: Not on file    Tobacco Counseling Counseling given: Not Answered   Clinical Intake:     Pain : No/denies pain                  Activities of Daily Living    06/05/2023    2:44 PM  In your present state of health, do you have any difficulty performing the following activities:  Hearing? 1  Vision? 0  Difficulty concentrating or making decisions? 1  Walking or climbing stairs? 1  Dressing or bathing? 1   Doing errands, shopping? 1  Preparing Food and eating ? N  Using the Toilet? N  In the past six months, have you accidently leaked urine? N  Do you have problems with loss of bowel control? N  Managing your Medications? Y  Managing your Finances? Y  Housekeeping or managing your Housekeeping? Y    Patient Care Team: Earnestine Mealing, MD as PCP - General (Family Medicine) Sharon Seller, NP as Nurse Practitioner (Geriatric Medicine)  Indicate any recent Medical Services you may have received from other than [facility] providers in the past year (date may be approximate).     Assessment:   This is a routine wellness examination for Brittany Bryant.  Hearing/Vision screen No results found.  Dietary issues and exercise activities discussed:     Goals Addressed   None    Depression Screen    06/05/2023    2:44 PM  PHQ 2/9 Scores  PHQ - 2 Score 0    Fall Risk    06/05/2023    2:44 PM  Fall Risk   Falls in the past year? 0    MEDICARE RISK AT HOME:   TIMED UP AND GO:  Was the test performed?  No    Cognitive Function:        Immunizations Immunization History  Administered Date(s) Administered   Covid-19, Mrna,Vaccine(Spikevax)35yrs and older 03/04/2023   Influenza Split 10/18/2015   Influenza, High Dose Seasonal PF 09/10/2022   Influenza-Unspecified 09/24/2016   Moderna Covid-19 Vaccine Bivalent Booster 49yrs & up 04/12/2021, 08/17/2021   Moderna SARS-COV2 Booster Vaccination 10/10/2020   Moderna Sars-Covid-2 Vaccination 11/30/2019, 01/07/2020   Pneumococcal Conjugate-13 10/30/2016, 12/24/2018   Pneumococcal Polysaccharide-23 11/29/2005   Pneumococcal-Unspecified 11/25/2005   Tdap 10/29/2011, 07/21/2022   Zoster Recombinant(Shingrix) 02/25/2018, 06/15/2018   Zoster, Live 02/23/2006    TDAP status: Up to date  Flu Vaccine status: Up to date  Pneumococcal vaccine status: Up to date  Covid-19 vaccine status: Information provided on how to obtain  vaccines.   Qualifies for Shingles Vaccine? Yes   Zostavax completed No   Shingrix Completed?: Yes  Screening Tests Health Maintenance  Topic Date Due   DEXA SCAN  Never done   INFLUENZA VACCINE  06/26/2023   COVID-19 Vaccine (7 - 2023-24 season) 07/04/2023   Medicare Annual Wellness (AWV)  06/04/2024   DTaP/Tdap/Td (3 - Td or Tdap) 07/21/2032  Pneumonia Vaccine 62+ Years old  Completed   Zoster Vaccines- Shingrix  Completed   HPV VACCINES  Aged Out    Health Maintenance  Health Maintenance Due  Topic Date Due   DEXA SCAN  Never done    Colorectal cancer screening: No longer required.   Mammogram status: No longer required due to age.  Bone Density status: Ordered today. Pt provided with contact info and advised to call to schedule appt.  Lung Cancer Screening: (Low Dose CT Chest recommended if Age 87-80 years, 20 pack-year currently smoking OR have quit w/in 15years.) does not qualify.   Lung Cancer Screening Referral: na  Additional Screening:  Hepatitis C Screening: does not qualify  Vision Screening: Recommended annual ophthalmology exams for early detection of glaucoma and other disorders of the eye. Is the patient up to date with their annual eye exam?  Yes  Who is the provider or what is the name of the office in which the patient attends annual eye exams? [facility]  If pt is not established with a provider, would they like to be referred to a provider to establish care? No .   Dental Screening: Recommended annual dental exams for proper oral hygiene    Community Resource Referral / Chronic Care Management: CRR required this visit?  No   CCM required this visit?  No     Plan:     I have personally reviewed and noted the following in the patient's chart:   Medical and social history Use of alcohol, tobacco or illicit drugs  Current medications and supplements including opioid prescriptions. Patient is not currently taking opioid  prescriptions. Functional ability and status Nutritional status Physical activity Advanced directives List of other physicians Hospitalizations, surgeries, and ER visits in previous 12 months Vitals Screenings to include cognitive, depression, and falls Referrals and appointments  In addition, I have reviewed and discussed with patient certain preventive protocols, quality metrics, and best practice recommendations. A written personalized care plan for preventive services as well as general preventive health recommendations were provided to patient.     Sharon Seller, NP   06/05/2023

## 2023-06-12 ENCOUNTER — Non-Acute Institutional Stay (SKILLED_NURSING_FACILITY): Payer: Medicare PPO | Admitting: Adult Health

## 2023-06-12 ENCOUNTER — Encounter: Payer: Self-pay | Admitting: Adult Health

## 2023-06-12 DIAGNOSIS — M810 Age-related osteoporosis without current pathological fracture: Secondary | ICD-10-CM

## 2023-06-12 DIAGNOSIS — I10 Essential (primary) hypertension: Secondary | ICD-10-CM | POA: Diagnosis not present

## 2023-06-12 NOTE — Progress Notes (Unsigned)
Location:  Other [facility]) Nursing Home Room Number: 205 A Place of Service:  SNF (31) Provider:  Kenard Gower, DNP, FNP-BC  Patient Care Team: Earnestine Mealing, MD as PCP - General (Family Medicine) Sharon Seller, NP as Nurse Practitioner (Geriatric Medicine)  Extended Emergency Contact Information Primary Emergency Contact: [facility] Address: [address]          [place], [place] [address] Darden Amber of [place] Home Phone: [phone] Mobile Phone: [phone] Relation: Son Secondary Emergency Contact: Candela,[name] Address: [address]          [place], [place] [address] Darden Amber of [place] Work Phone: [phone] Mobile Phone: [phone] Relation: Son  Code Status:  Full Code   Goals of care: Advanced Directive information    06/12/2023   11:54 AM  Advanced Directives  Does Patient Have a Medical Advance Directive? No  Would patient like information on creating a medical advance directive? No - Patient declined     Chief Complaint  Patient presents with   Acute Visit    Discuss bone density     HPI:  Pt is a 89 y.o. female seen today for medical management of chronic diseases.  ***   Past Medical History:  Diagnosis Date   Anemia    Per [facility] Records   Carpal tunnel syndrome    Per [facility] Records   Difficulty walking    Per [facility] Records   Gait disturbance    Hypertension    Low serum vitamin D    Stage 3b chronic kidney disease (HCC)    Tinnitus    Per [facility] Records   Past Surgical History:  Procedure Laterality Date   APPENDECTOMY     BACK SURGERY     CATARACT EXTRACTION, BILATERAL     CORNEAL TRANSPLANT     TOTAL ABDOMINAL HYSTERECTOMY W/ BILATERAL SALPINGOOPHORECTOMY     TUBAL LIGATION      No Known Allergies  Outpatient Encounter Medications as of 06/12/2023  Medication Sig   amLODipine (NORVASC) 5 MG tablet Take 5 mg by mouth daily.   calcium carbonate (OSCAL) 1500 (600 Ca) MG TABS  tablet Take 600 mg of elemental calcium by mouth 2 (two) times daily with a meal.   Cholecalciferol (VITAMIN D3) 50 MCG (2000 UT) TABS Take 1 tablet by mouth daily.   lisinopril (ZESTRIL) 40 MG tablet Take 40 mg by mouth daily.   melatonin 5 MG TABS Take 5 mg by mouth at bedtime.   metoprolol succinate (TOPROL-XL) 25 MG 24 hr tablet Take 1 tablet by mouth in the morning and at bedtime.   Wheat Dextrin (BENEFIBER DRINK MIX PO) 2 tsp by mouth two times daily for loose stools   No facility-administered encounter medications on file as of 06/12/2023.    Review of Systems ***    Immunization History  Administered Date(s) Administered   Covid-19, Mrna,Vaccine(Spikevax)47yrs and older 03/04/2023   Influenza Split 10/18/2015   Influenza, High Dose Seasonal PF 09/10/2022   Influenza-Unspecified 09/24/2016   Moderna Covid-19 Vaccine Bivalent Booster 68yrs & up 04/12/2021, 08/17/2021   Moderna SARS-COV2 Booster Vaccination 10/10/2020   Moderna Sars-Covid-2 Vaccination 11/30/2019, 01/07/2020   Pneumococcal Conjugate-13 10/30/2016, 12/24/2018   Pneumococcal Polysaccharide-23 11/29/2005   Pneumococcal-Unspecified 11/25/2005   Tdap 10/29/2011, 07/21/2022   Zoster Recombinant(Shingrix) 02/25/2018, 06/15/2018   Zoster, Live 02/23/2006   Pertinent  Health Maintenance Due  Topic Date Due   DEXA SCAN  Never done   INFLUENZA VACCINE  06/26/2023  04/16/2021   12:12 PM 07/21/2022    6:58 PM 07/22/2022    1:12 AM 06/05/2023    2:44 PM  Fall Risk  Falls in the past year?    0  (RETIRED) Patient Fall Risk Level High fall risk Low fall risk Low fall risk      Vitals:   06/12/23 1152  BP: (!) 146/77  Pulse: 62  Weight: 132 lb (59.9 kg)  Height: 5\' 1"  (1.549 m)   Body mass index is 24.94 kg/m.  Physical Exam     Labs reviewed: Recent Labs    07/21/22 1909 11/21/22 0000 01/27/23 0000 04/28/23 0000  NA 131* 135* 137 134*  K 3.6 4.2 4.2 4.1  CL 96* 101 100 99  CO2 25 29* 30* 29*   GLUCOSE 122*  --   --   --   BUN 19 24* 21 17  CREATININE 1.01* 1.0 0.9 0.8  CALCIUM 8.6* 8.8 9.0 8.3*   No results for input(s): "AST", "ALT", "ALKPHOS", "BILITOT", "PROT", "ALBUMIN" in the last 8760 hours. Recent Labs    07/21/22 1909 11/21/22 0000 12/05/22 0000 01/27/23 0000 04/28/23 0000  WBC 6.2   < > 5.1 4.8 4.5  NEUTROABS 4.2   < > 3,019.00 2,803.00 2,669.00  HGB 11.3*   < > 10.0* 12.1 11.1*  HCT 34.3*   < > 30* 35* 33*  MCV 98.8  --   --   --   --   PLT 257   < > 228 259 276   < > = values in this interval not displayed.   No results found for: "TSH" No results found for: "HGBA1C" No results found for: "CHOL", "HDL", "LDLCALC", "LDLDIRECT", "TRIG", "CHOLHDL"  Significant Diagnostic Results in last 30 days:  No results found.  Assessment/Plan ***   Family/ staff Communication: Discussed plan of care with resident and charge nurse  Labs/tests ordered:     Kenard Gower, DNP, MSN, FNP-BC [facility] [phone] (Monday-Friday 8:00 a.m. - 5:00 p.m.) [phone] (after hours)

## 2023-07-14 ENCOUNTER — Encounter: Payer: Self-pay | Admitting: Student

## 2023-07-14 ENCOUNTER — Non-Acute Institutional Stay (SKILLED_NURSING_FACILITY): Payer: Medicare PPO | Admitting: Student

## 2023-07-14 DIAGNOSIS — I10 Essential (primary) hypertension: Secondary | ICD-10-CM

## 2023-07-14 DIAGNOSIS — R440 Auditory hallucinations: Secondary | ICD-10-CM

## 2023-07-14 DIAGNOSIS — N1832 Chronic kidney disease, stage 3b: Secondary | ICD-10-CM | POA: Diagnosis not present

## 2023-07-14 DIAGNOSIS — G5601 Carpal tunnel syndrome, right upper limb: Secondary | ICD-10-CM | POA: Diagnosis not present

## 2023-07-14 DIAGNOSIS — M810 Age-related osteoporosis without current pathological fracture: Secondary | ICD-10-CM

## 2023-07-14 NOTE — Progress Notes (Unsigned)
Location:  Other [facility].  Nursing Home Room Number: [facility] 205A Place of Service:  SNF [phone]) Provider:  Earnestine Mealing, MD  Patient Care Team: Earnestine Mealing, MD as PCP - General (Family Medicine) Sharon Seller, NP as Nurse Practitioner (Geriatric Medicine)  Extended Emergency Contact Information Primary Emergency Contact: [facility] Address: [address]          [place], [place] [address] Darden Amber of [place] Home Phone: [phone] Mobile Phone: [phone] Relation: Son Secondary Emergency Contact: Muchow,[name] Address: [address]          [place], [place] [address] Darden Amber of [place] Work Phone: [phone] Mobile Phone: [phone] Relation: Son  Code Status:  Full Code.  Goals of care: Advanced Directive information    07/14/2023    9:19 AM  Advanced Directives  Does Patient Have a Medical Advance Directive? No  Would patient like information on creating a medical advance directive? No - Patient declined     Chief Complaint  Patient presents with   Medical Management of Chronic Issues    Medical Management of Chronic Issues.     HPI:  Pt is a 89 y.o. female seen today for medical management of chronic diseases.  She has been eating well. Urinating well. Having some constipation. Last BM 3 days ago. Usually improves with prune juice, however, no BM today. Has been on benefiber and okay to come off. Drinks lots of water.  She walks well wit  Denies pain at this time. Sleeps well. She gets 4 ounces of wine at night and it helps her relax to got to sleep.   She has lived here for 15 years. Started with her husband then went to [facility]. She has no complaints.      Past Medical History:  Diagnosis Date   Anemia    Per [facility] Records   Carpal tunnel syndrome    Per [facility] Records   Difficulty walking    Per [facility] Records   Gait disturbance    Hypertension    Low serum vitamin D    Stage 3b chronic kidney  disease (HCC)    Tinnitus    Per [facility] Records   Past Surgical History:  Procedure Laterality Date   APPENDECTOMY     BACK SURGERY     CATARACT EXTRACTION, BILATERAL     CORNEAL TRANSPLANT     TOTAL ABDOMINAL HYSTERECTOMY W/ BILATERAL SALPINGOOPHORECTOMY     TUBAL LIGATION      No Known Allergies  Outpatient Encounter Medications as of 07/14/2023  Medication Sig   amLODipine (NORVASC) 5 MG tablet Take 5 mg by mouth daily.   calcium carbonate (OSCAL) 1500 (600 Ca) MG TABS tablet Take 600 mg of elemental calcium by mouth 2 (two) times daily with a meal.   Cholecalciferol (VITAMIN D3) 50 MCG (2000 UT) TABS Take 1 tablet by mouth daily.   lisinopril (ZESTRIL) 40 MG tablet Take 40 mg by mouth daily.   melatonin 5 MG TABS Take 5 mg by mouth at bedtime.   metoprolol succinate (TOPROL-XL) 25 MG 24 hr tablet Take 1 tablet by mouth in the morning and at bedtime.   Wheat Dextrin (BENEFIBER DRINK MIX PO) 2 tsp by mouth two times daily for loose stools   No facility-administered encounter medications on file as of 07/14/2023.    Review of Systems  Immunization History  Administered Date(s) Administered   Covid-19, Mrna,Vaccine(Spikevax)77yrs and older 03/04/2023   Influenza Split 10/18/2015  Influenza, High Dose Seasonal PF 09/10/2022   Influenza-Unspecified 09/24/2016   Moderna Covid-19 Vaccine Bivalent Booster 48yrs & up 04/12/2021, 08/17/2021   Moderna SARS-COV2 Booster Vaccination 10/10/2020   Moderna Sars-Covid-2 Vaccination 11/30/2019, 01/07/2020   PNEUMOCOCCAL CONJUGATE-20 09/06/2022   Pneumococcal Conjugate-13 10/30/2016, 12/24/2018   Pneumococcal Polysaccharide-23 11/29/2005   Pneumococcal-Unspecified 11/25/2005   Tdap 10/29/2011, 07/21/2022   Zoster Recombinant(Shingrix) 02/25/2018, 06/15/2018   Zoster, Live 02/23/2006   Pertinent  Health Maintenance Due  Topic Date Due   INFLUENZA VACCINE  06/26/2023   DEXA SCAN  Completed      04/16/2021   12:12 PM 07/21/2022     6:58 PM 07/22/2022    1:12 AM 06/05/2023    2:44 PM  Fall Risk  Falls in the past year?    0  (RETIRED) Patient Fall Risk Level High fall risk Low fall risk Low fall risk    Functional Status Survey:    Vitals:   07/14/23 0911  BP: 129/71  Pulse: 68  Resp: 18  Temp: (!) 97.5 F (36.4 C)  SpO2: 97%  Weight: 133 lb 6.4 oz (60.5 kg)  Height: 5\' 1"  (1.549 m)   Body mass index is 25.21 kg/m. Physical Exam Vitals reviewed.  Constitutional:      Appearance: Normal appearance.  Cardiovascular:     Rate and Rhythm: Normal rate and regular rhythm.     Pulses: Normal pulses.     Heart sounds: Normal heart sounds.  Pulmonary:     Effort: Pulmonary effort is normal.     Breath sounds: Normal breath sounds.  Abdominal:     General: Abdomen is flat.     Palpations: Abdomen is soft.  Skin:    General: Skin is warm and dry.  Neurological:     Mental Status: She is alert and oriented to person, place, and time.     Labs reviewed: Recent Labs    07/21/22 1909 11/21/22 0000 01/27/23 0000 04/28/23 0000  NA 131* 135* 137 134*  K 3.6 4.2 4.2 4.1  CL 96* 101 100 99  CO2 25 29* 30* 29*  GLUCOSE 122*  --   --   --   BUN 19 24* 21 17  CREATININE 1.01* 1.0 0.9 0.8  CALCIUM 8.6* 8.8 9.0 8.3*   No results for input(s): "AST", "ALT", "ALKPHOS", "BILITOT", "PROT", "ALBUMIN" in the last 8760 hours. Recent Labs    07/21/22 1909 11/21/22 0000 12/05/22 0000 01/27/23 0000 04/28/23 0000  WBC 6.2   < > 5.1 4.8 4.5  NEUTROABS 4.2   < > 3,019.00 2,803.00 2,669.00  HGB 11.3*   < > 10.0* 12.1 11.1*  HCT 34.3*   < > 30* 35* 33*  MCV 98.8  --   --   --   --   PLT 257   < > 228 259 276   < > = values in this interval not displayed.   No results found for: "TSH" No results found for: "HGBA1C" No results found for: "CHOL", "HDL", "LDLCALC", "LDLDIRECT", "TRIG", "CHOLHDL"  Significant Diagnostic Results in last 30 days:  No results found.  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 Recently started on vitamin D and calcium tolerating well. No concerns with pain. BP well-controlled on current regimen. Auditory hallucinations stable and not alarming a this time. Fu with routine in 60 days.  Family/ staff Communication: nursing  Labs/tests ordered:  q54mo labs

## 2023-07-15 ENCOUNTER — Encounter: Payer: Self-pay | Admitting: Student

## 2023-07-31 LAB — BASIC METABOLIC PANEL
BUN: 20 (ref 4–21)
CO2: 29 — AB (ref 13–22)
Chloride: 98 — AB (ref 99–108)
Creatinine: 1 (ref 0.5–1.1)
Glucose: 126
Potassium: 4 meq/L (ref 3.5–5.1)
Sodium: 136 — AB (ref 137–147)

## 2023-07-31 LAB — CBC AND DIFFERENTIAL
HCT: 34 — AB (ref 36–46)
Hemoglobin: 11.3 — AB (ref 12.0–16.0)
Neutrophils Absolute: 2853
Platelets: 273 10*3/uL (ref 150–400)
WBC: 4.7

## 2023-07-31 LAB — COMPREHENSIVE METABOLIC PANEL
Calcium: 8.8 (ref 8.7–10.7)
eGFR: 55

## 2023-07-31 LAB — CBC: RBC: 3.5 — AB (ref 3.87–5.11)

## 2023-08-06 ENCOUNTER — Encounter: Payer: Self-pay | Admitting: Student

## 2023-08-06 ENCOUNTER — Non-Acute Institutional Stay (SKILLED_NURSING_FACILITY): Payer: Self-pay | Admitting: Student

## 2023-08-06 DIAGNOSIS — R600 Localized edema: Secondary | ICD-10-CM | POA: Diagnosis not present

## 2023-08-06 NOTE — Progress Notes (Signed)
Location:  Other [facility].  Nursing Home Room Number: [facility] 205A Place of Service:  SNF [phone]) Provider:  Earnestine Mealing, MD  Patient Care Team: Earnestine Mealing, MD as PCP - General (Family Medicine) Sharon Seller, NP as Nurse Practitioner (Geriatric Medicine)  Extended Emergency Contact Information Primary Emergency Contact: [facility] Address: [address]          [place], [place] [address] Darden Amber of [place] Home Phone: [phone] Mobile Phone: [phone] Relation: Son Secondary Emergency Contact: Struble,[name] Address: [address]          [place], [place] [address] Darden Amber of [place] Work Phone: [phone] Mobile Phone: [phone] Relation: Son  Code Status:  Full Code Goals of care: Advanced Directive information    08/06/2023    3:02 PM  Advanced Directives  Does Patient Have a Medical Advance Directive? No  Would patient like information on creating a medical advance directive? No - Patient declined     Chief Complaint  Patient presents with   Acute Visit    Leg Weeping.     HPI:  Pt is a 89 y.o. female seen today for an acute visit for Leg Weeping.  Bilateral leg swelling. Clear fluid. No clear injury. No pain.    Past Medical History:  Diagnosis Date   Anemia    Per [facility] Records   Carpal tunnel syndrome    Per [facility] Records   Difficulty walking    Per [employer] Records   Gait disturbance    Hypertension    Low serum vitamin D    Stage 3b chronic kidney disease (HCC)    Tinnitus    Per [facility] Records   Past Surgical History:  Procedure Laterality Date   APPENDECTOMY     BACK SURGERY     CATARACT EXTRACTION, BILATERAL     CORNEAL TRANSPLANT     TOTAL ABDOMINAL HYSTERECTOMY W/ BILATERAL SALPINGOOPHORECTOMY     TUBAL LIGATION      No Known Allergies  Outpatient Encounter Medications as of 08/06/2023  Medication Sig   amLODipine (NORVASC) 5 MG tablet Take 5 mg by mouth daily.   calcium  carbonate (OSCAL) 1500 (600 Ca) MG TABS tablet Take 600 mg of elemental calcium by mouth 2 (two) times daily with a meal.   Cholecalciferol (VITAMIN D3) 50 MCG (2000 UT) TABS Take 1 tablet by mouth daily.   lisinopril (ZESTRIL) 40 MG tablet Take 40 mg by mouth daily.   melatonin 5 MG TABS Take 5 mg by mouth at bedtime.   metoprolol succinate (TOPROL-XL) 25 MG 24 hr tablet Take 1 tablet by mouth in the morning and at bedtime.   polyethylene glycol (MIRALAX / GLYCOLAX) 17 g packet Take 17 g by mouth daily.   [DISCONTINUED] Wheat Dextrin (BENEFIBER DRINK MIX PO) 2 tsp by mouth two times daily for loose stools   No facility-administered encounter medications on file as of 08/06/2023.    Review of Systems  Immunization History  Administered Date(s) Administered   Covid-19, Mrna,Vaccine(Spikevax)23yrs and older 03/04/2023   Influenza Split 10/18/2015   Influenza, High Dose Seasonal PF 09/10/2022   Influenza-Unspecified 09/24/2016   Moderna Covid-19 Vaccine Bivalent Booster 73yrs & up 04/12/2021, 08/17/2021   Moderna SARS-COV2 Booster Vaccination 10/10/2020   Moderna Sars-Covid-2 Vaccination 11/30/2019, 01/07/2020   PNEUMOCOCCAL CONJUGATE-20 09/06/2022   Pneumococcal Conjugate-13 10/30/2016, 12/24/2018   Pneumococcal Polysaccharide-23 11/29/2005   Pneumococcal-Unspecified 11/25/2005   Tdap 10/29/2011, 07/21/2022   Zoster Recombinant(Shingrix) 02/25/2018, 06/15/2018  Zoster, Live 02/23/2006   Pertinent  Health Maintenance Due  Topic Date Due   INFLUENZA VACCINE  06/26/2023   DEXA SCAN  Completed      04/16/2021   12:12 PM 07/21/2022    6:58 PM 07/22/2022    1:12 AM 06/05/2023    2:44 PM  Fall Risk  Falls in the past year?    0  (RETIRED) Patient Fall Risk Level High fall risk Low fall risk Low fall risk    Functional Status Survey:    Vitals:   08/06/23 1457  BP: 136/76  Pulse: 62  Resp: 18  Temp: 97.8 F (36.6 C)  SpO2: 95%  Weight: 134 lb 11.2 oz (61.1 kg)  Height: 5'  1" (1.549 m)   Body mass index is 25.45 kg/m. Physical Exam Musculoskeletal:     Comments: Bilateral leg swelling, non-tender, no erythema.  Skin:    Comments: Thin skin  Neurological:     Mental Status: She is alert.     Labs reviewed: Recent Labs    01/27/23 0000 04/28/23 0000 07/31/23 0000  NA 137 134* 136*  K 4.2 4.1 4.0  CL 100 99 98*  CO2 30* 29* 29*  BUN 21 17 20   CREATININE 0.9 0.8 1.0  CALCIUM 9.0 8.3* 8.8   No results for input(s): "AST", "ALT", "ALKPHOS", "BILITOT", "PROT", "ALBUMIN" in the last 8760 hours. Recent Labs    01/27/23 0000 04/28/23 0000 07/31/23 0000  WBC 4.8 4.5 4.7  NEUTROABS 2,803.00 2,669.00 2,853.00  HGB 12.1 11.1* 11.3*  HCT 35* 33* 34*  PLT 259 276 273   No results found for: "TSH" No results found for: "HGBA1C" No results found for: "CHOL", "HDL", "LDLCALC", "LDLDIRECT", "TRIG", "CHOLHDL"  Significant Diagnostic Results in last 30 days:  No results found.  Assessment/Plan Bilateral leg edema Patient with bilateral edema which has been chronic and acutely worsened. Notable thin skin bilateral shins. Non-tender, nonerythematous. Concern for fluid collection due to underlying cardiac vs renal issue. Plan to start bilateral unna boots q3d for 10 days. F/u labs, if signs of volume overload from a cardiac standpoint will start diuretic and order echocardiogram  Family/ staff Communication: Nursing  Labs/tests ordered:  CBC, BMP, BNP

## 2023-08-07 LAB — HEPATIC FUNCTION PANEL
ALT: 9 U/L (ref 7–35)
AST: 18 (ref 13–35)
Alkaline Phosphatase: 72 (ref 25–125)
Bilirubin, Total: 0.6

## 2023-08-07 LAB — COMPREHENSIVE METABOLIC PANEL
Albumin: 3.8 (ref 3.5–5.0)
Globulin: 2.5

## 2023-08-11 ENCOUNTER — Encounter: Payer: Self-pay | Admitting: Student

## 2023-08-11 ENCOUNTER — Non-Acute Institutional Stay (SKILLED_NURSING_FACILITY): Payer: Medicare PPO | Admitting: Student

## 2023-08-11 DIAGNOSIS — M7989 Other specified soft tissue disorders: Secondary | ICD-10-CM

## 2023-08-11 DIAGNOSIS — R7989 Other specified abnormal findings of blood chemistry: Secondary | ICD-10-CM

## 2023-08-11 NOTE — Progress Notes (Signed)
Location:  Other [facility].  Nursing Home Room [facility]: [facility] Place of Service:  SNF [phone]) Provider:  Earnestine Mealing, MD  Patient Care Team: Earnestine Mealing, MD as PCP - General (Family Medicine) Sharon Seller, NP as Nurse Practitioner (Geriatric Medicine)  Extended Emergency Contact Information Primary Emergency Contact: [facility] Address: [address]          [place], [place] [address] Darden Amber of [place] Home Phone: [phone] Mobile Phone: [phone] Relation: Son Secondary Emergency Contact: Lesko,[name] Address: [address]          [place], [place] [address] Darden Amber of [place] Work Phone: [phone] Mobile Phone: [phone] Relation: Son  Code Status:  Full Code Goals of care: Advanced Directive information    08/11/2023    2:17 PM  Advanced Directives  Does Patient Have a Medical Advance Directive? No  Would patient like information on creating a medical advance directive? No - Patient declined     Chief Complaint  Patient presents with   Acute Visit    Leg Swelling.     HPI:  Pt is a 89 y.o. female seen today for an acute visit for leg Swelling.   She states she is well. Her leg swelling has improved with [employer]. Discussed concern heart isn't squeezing as well as previously, need ECG and Echocardiogram.   Spoke with patient's son, Molly Maduro, regarding BNP elevated to >700. Discussed plan and he agreed with the plan.   Past Medical History:  Diagnosis Date   Anemia    Per [facility] Records   Carpal tunnel syndrome    Per [facility] Records   Difficulty walking    Per [employer] Records   Gait disturbance    Hypertension    Low serum vitamin D    Stage 3b chronic kidney disease (HCC)    Tinnitus    Per [facility] Records   Past Surgical History:  Procedure Laterality Date   APPENDECTOMY     BACK SURGERY     CATARACT EXTRACTION, BILATERAL     CORNEAL TRANSPLANT     TOTAL ABDOMINAL HYSTERECTOMY W/ BILATERAL  SALPINGOOPHORECTOMY     TUBAL LIGATION      No Known Allergies  Outpatient Encounter Medications as of 08/11/2023  Medication Sig   amLODipine (NORVASC) 5 MG tablet Take 5 mg by mouth daily.   calcium carbonate (OSCAL) 1500 (600 Ca) MG TABS tablet Take 600 mg of elemental calcium by mouth 2 (two) times daily with a meal.   Cholecalciferol (VITAMIN D3) 50 MCG (2000 UT) TABS Take 1 tablet by mouth daily.   furosemide (LASIX) 40 MG tablet Take 40 mg by mouth daily.   lisinopril (ZESTRIL) 40 MG tablet Take 40 mg by mouth daily.   melatonin 5 MG TABS Take 5 mg by mouth at bedtime.   metoprolol succinate (TOPROL-XL) 25 MG 24 hr tablet Take 1 tablet by mouth in the morning and at bedtime.   polyethylene glycol (MIRALAX / GLYCOLAX) 17 g packet Take 17 g by mouth daily.   No facility-administered encounter medications on file as of 08/11/2023.    Review of Systems  Immunization History  Administered Date(s) Administered   Covid-19, Mrna,Vaccine(Spikevax)56yrs and older 03/04/2023   Influenza Split 10/18/2015   Influenza, High Dose Seasonal PF 09/10/2022   Influenza-Unspecified 09/24/2016   Moderna Covid-19 Vaccine Bivalent Booster 69yrs & up 04/12/2021, 08/17/2021   Moderna SARS-COV2 Booster Vaccination 10/10/2020   Moderna Sars-Covid-2 Vaccination 11/30/2019, 01/07/2020  PNEUMOCOCCAL CONJUGATE-20 09/06/2022   Pneumococcal Conjugate-13 10/30/2016, 12/24/2018   Pneumococcal Polysaccharide-23 11/29/2005   Pneumococcal-Unspecified 11/25/2005   Tdap 10/29/2011, 07/21/2022   Zoster Recombinant(Shingrix) 02/25/2018, 06/15/2018   Zoster, Live 02/23/2006   Pertinent  Health Maintenance Due  Topic Date Due   INFLUENZA VACCINE  06/26/2023   DEXA SCAN  Completed      04/16/2021   12:12 PM 07/21/2022    6:58 PM 07/22/2022    1:12 AM 06/05/2023    2:44 PM  Fall Risk  Falls in the past year?    0  (RETIRED) Patient Fall Risk Level High fall risk Low fall risk Low fall risk    Functional  Status Survey:    Vitals:   08/11/23 1413  BP: 136/76  Pulse: 62  Resp: 18  Temp: 97.8 F (36.6 C)  SpO2: 95%  Weight: 134 lb 11.2 oz (61.1 kg)  Height: 5\' 1"  (1.549 m)   Body mass index is 25.45 kg/m. Physical Exam Constitutional:      Appearance: Normal appearance.  Cardiovascular:     Rate and Rhythm: Normal rate.     Pulses: Normal pulses.  Pulmonary:     Effort: Pulmonary effort is normal.  Abdominal:     General: Abdomen is flat.     Palpations: Abdomen is soft.  Musculoskeletal:     Comments: Bilateral Unna Boots in place  Neurological:     Mental Status: She is alert.     Labs reviewed: Recent Labs    01/27/23 0000 04/28/23 0000 07/31/23 0000  NA 137 134* 136*  K 4.2 4.1 4.0  CL 100 99 98*  CO2 30* 29* 29*  BUN 21 17 20   CREATININE 0.9 0.8 1.0  CALCIUM 9.0 8.3* 8.8   Recent Labs    08/07/23 0000  AST 18  ALT 9  ALKPHOS 72  ALBUMIN 3.8   Recent Labs    01/27/23 0000 04/28/23 0000 07/31/23 0000  WBC 4.8 4.5 4.7  NEUTROABS 2,803.00 2,669.00 2,853.00  HGB 12.1 11.1* 11.3*  HCT 35* 33* 34*  PLT 259 276 273   No results found for: "TSH" No results found for: "HGBA1C" No results found for: "CHOL", "HDL", "LDLCALC", "LDLDIRECT", "TRIG", "CHOLHDL"  Significant Diagnostic Results in last 30 days:  No results found.  Assessment/Plan Elevated brain natriuretic peptide (BNP) level  Leg swelling Patient with recent increase in leg swelling with oozing of the skin. Improved leg swelling with unna boots. Will continue for 10 days total then return to regular compression stockings. Discussed possible need for fluid restriction to 1500 mL per day as well as low sodium diet. F/u echocardiogram and ecg.   Family/ staff Communication: nursing, Molly Maduro  Labs/tests ordered:  ECG, Echocardiogram, BMP in 1 week.    I spent greater than 30 minutes for the care of this patient in face to face time, chart review, clinical documentation, patient  education.

## 2023-08-18 LAB — COMPREHENSIVE METABOLIC PANEL
Calcium: 8.8 (ref 8.7–10.7)
eGFR: 43

## 2023-08-18 LAB — BASIC METABOLIC PANEL
BUN: 32 — AB (ref 4–21)
CO2: 34 — AB (ref 13–22)
Chloride: 95 — AB (ref 99–108)
Creatinine: 1.2 — AB (ref 0.5–1.1)
Glucose: 77
Potassium: 3.9 meq/L (ref 3.5–5.1)
Sodium: 136 — AB (ref 137–147)

## 2023-08-22 ENCOUNTER — Encounter: Payer: Self-pay | Admitting: Adult Health

## 2023-08-22 ENCOUNTER — Non-Acute Institutional Stay (SKILLED_NURSING_FACILITY): Payer: Medicare PPO | Admitting: Adult Health

## 2023-08-22 DIAGNOSIS — R600 Localized edema: Secondary | ICD-10-CM | POA: Diagnosis not present

## 2023-08-22 DIAGNOSIS — N1832 Chronic kidney disease, stage 3b: Secondary | ICD-10-CM | POA: Diagnosis not present

## 2023-08-22 NOTE — Progress Notes (Unsigned)
Location:  Other Nursing Home Room Number: [facility] 205A Place of Service:  SNF ([phone]) Provider:  Kenard Gower, DNP, FNP-BC  Patient Care Team: Earnestine Mealing, MD as PCP - General (Family Medicine) Sharon Seller, NP as Nurse Practitioner (Geriatric Medicine)  Extended Emergency Contact Information Primary Emergency Contact: [facility] Address: [address]          [place], [place] [address] Darden Amber of [place] Home Phone: [phone] Mobile Phone: [phone] Relation: Son Secondary Emergency Contact: Kovacic,[name] Address: [address]          [place], [place] [address] Darden Amber of [place] Work Phone: [phone] Mobile Phone: [phone] Relation: Son  Code Status:  Full Code  Goals of care: Advanced Directive information    08/11/2023    2:17 PM  Advanced Directives  Does Patient Have a Medical Advance Directive? No  Would patient like information on creating a medical advance directive? No - Patient declined     Chief Complaint  Patient presents with   Acute Visit     lower extremity edema     HPI:  Pt is a 89 y.o. female seen today for medical management of chronic diseases.  ***   Past Medical History:  Diagnosis Date   Anemia    Per [facility] Records   Carpal tunnel syndrome    Per [employer] Records   Difficulty walking    Per [employer] Records   Gait disturbance    Hypertension    Low serum vitamin D    Stage 3b chronic kidney disease (HCC)    Tinnitus    Per [facility] Records   Past Surgical History:  Procedure Laterality Date   APPENDECTOMY     BACK SURGERY     CATARACT EXTRACTION, BILATERAL     CORNEAL TRANSPLANT     TOTAL ABDOMINAL HYSTERECTOMY W/ BILATERAL SALPINGOOPHORECTOMY     TUBAL LIGATION      No Known Allergies  Outpatient Encounter Medications as of 08/22/2023  Medication Sig   amLODipine (NORVASC) 5 MG tablet Take 5 mg by mouth daily.   calcium carbonate (OSCAL) 1500 (600 Ca) MG TABS  tablet Take 600 mg of elemental calcium by mouth 2 (two) times daily with a meal.   Cholecalciferol (VITAMIN D3) 50 MCG (2000 UT) TABS Take 1 tablet by mouth daily.   furosemide (LASIX) 40 MG tablet Take 40 mg by mouth daily.   lisinopril (ZESTRIL) 40 MG tablet Take 40 mg by mouth daily.   melatonin 5 MG TABS Take 5 mg by mouth at bedtime.   metoprolol succinate (TOPROL-XL) 25 MG 24 hr tablet Take 1 tablet by mouth in the morning and at bedtime.   polyethylene glycol (MIRALAX / GLYCOLAX) 17 g packet Take 17 g by mouth daily.   No facility-administered encounter medications on file as of 08/22/2023.    Review of Systems ***    Immunization History  Administered Date(s) Administered   Influenza Split 10/18/2015   Influenza, High Dose Seasonal PF 09/10/2022   Influenza-Unspecified 09/24/2016   Moderna Covid-19 Fall Seasonal Vaccine 56yrs & older 03/04/2023   Moderna Covid-19 Vaccine Bivalent Booster 75yrs & up 04/12/2021, 08/17/2021   Moderna SARS-COV2 Booster Vaccination 10/10/2020   Moderna Sars-Covid-2 Vaccination 11/30/2019, 01/07/2020   PNEUMOCOCCAL CONJUGATE-20 09/06/2022   Pneumococcal Conjugate-13 10/30/2016, 12/24/2018   Pneumococcal Polysaccharide-23 11/29/2005   Pneumococcal-Unspecified 11/25/2005   Tdap 10/29/2011, 07/21/2022   Zoster Recombinant(Shingrix) 02/25/2018, 06/15/2018   Zoster, Live 02/23/2006   Pertinent  Health Maintenance Due  Topic Date Due   INFLUENZA VACCINE  06/26/2023   DEXA SCAN  Completed      04/16/2021   12:12 PM 07/21/2022    6:58 PM 07/22/2022    1:12 AM 06/05/2023    2:44 PM  Fall Risk  Falls in the past year?    0  (RETIRED) Patient Fall Risk Level High fall risk Low fall risk Low fall risk      Vitals:   08/22/23 1608  BP: (!) 149/80  Pulse: 79  Resp: 18  Temp: 97.8 F (36.6 C)  SpO2: 94%  Weight: 134 lb 11.2 oz (61.1 kg)  Height: 5\' 1"  (1.549 m)   Body mass index is 25.45 kg/m.  Physical Exam     Labs  reviewed: Recent Labs    04/28/23 0000 07/31/23 0000 08/18/23 0000  NA 134* 136* 136*  K 4.1 4.0 3.9  CL 99 98* 95*  CO2 29* 29* 34*  BUN 17 20 32*  CREATININE 0.8 1.0 1.2*  CALCIUM 8.3* 8.8 8.8   Recent Labs    08/07/23 0000  AST 18  ALT 9  ALKPHOS 72  ALBUMIN 3.8   Recent Labs    01/27/23 0000 04/28/23 0000 07/31/23 0000  WBC 4.8 4.5 4.7  NEUTROABS 2,803.00 2,669.00 2,853.00  HGB 12.1 11.1* 11.3*  HCT 35* 33* 34*  PLT 259 276 273   No results found for: "TSH" No results found for: "HGBA1C" No results found for: "CHOL", "HDL", "LDLCALC", "LDLDIRECT", "TRIG", "CHOLHDL"  Significant Diagnostic Results in last 30 days:  No results found.  Assessment/Plan ***   Family/ staff Communication: Discussed plan of care with resident and charge nurse  Labs/tests ordered:     Kenard Gower, DNP, MSN, FNP-BC [facility] [phone] (Monday-Friday 8:00 a.m. - 5:00 p.m.) [phone] (after hours)

## 2023-08-25 LAB — BASIC METABOLIC PANEL
BUN: 28 — AB (ref 4–21)
CO2: 32 — AB (ref 13–22)
Chloride: 97 — AB (ref 99–108)
Creatinine: 0.9 (ref 0.5–1.1)
Glucose: 80
Potassium: 3.9 meq/L (ref 3.5–5.1)
Sodium: 136 — AB (ref 137–147)

## 2023-08-25 LAB — COMPREHENSIVE METABOLIC PANEL
Calcium: 9 (ref 8.7–10.7)
eGFR: 59

## 2023-08-28 ENCOUNTER — Non-Acute Institutional Stay (SKILLED_NURSING_FACILITY): Payer: Medicare PPO | Admitting: Nurse Practitioner

## 2023-08-28 ENCOUNTER — Encounter: Payer: Self-pay | Admitting: Nurse Practitioner

## 2023-08-28 DIAGNOSIS — I10 Essential (primary) hypertension: Secondary | ICD-10-CM

## 2023-08-28 DIAGNOSIS — D649 Anemia, unspecified: Secondary | ICD-10-CM

## 2023-08-28 DIAGNOSIS — E871 Hypo-osmolality and hyponatremia: Secondary | ICD-10-CM

## 2023-08-28 DIAGNOSIS — R600 Localized edema: Secondary | ICD-10-CM | POA: Diagnosis not present

## 2023-08-28 DIAGNOSIS — N1832 Chronic kidney disease, stage 3b: Secondary | ICD-10-CM | POA: Diagnosis not present

## 2023-08-28 NOTE — Progress Notes (Signed)
Location:  Other [facility].  Nursing Home Room Number: [facility] 205A Place of Service:  SNF ([phone]) Abbey Chatters, NP  PCP: Earnestine Mealing, MD  Patient Care Team: Earnestine Mealing, MD as PCP - General (Family Medicine) Sharon Seller, NP as Nurse Practitioner (Geriatric Medicine)  Extended Emergency Contact Information Primary Emergency Contact: [facility] Address: [address]          [place], [place] [address] Darden Amber of [place] Home Phone: [phone] Mobile Phone: [phone] Relation: Son Secondary Emergency Contact: Klecker,[name] Address: [address]          [place], [place] [address] Darden Amber of [place] Work Phone: [phone] Mobile Phone: [phone] Relation: Son  Goals of care: Advanced Directive information    08/28/2023   11:27 AM  Advanced Directives  Does Patient Have a Medical Advance Directive? Yes  Does patient want to make changes to medical advance directive? No - Patient declined     Chief Complaint  Patient presents with   Medical Management of Chronic Issues    Medical Management of Chronic Issues.     HPI:  Pt is a 89 y.o. female seen today for medical management of chronic disease.  She has been started on lasix due to LE edema. Swelling is much better.  No shortness of breath or chest pains.   Sleeping well   Bp controlled   Reports she is doing well.   Continues to exercise daily and goal is to keep walking  Past Medical History:  Diagnosis Date   Anemia    Per [facility] Records   Carpal tunnel syndrome    Per [facility] Records   Difficulty walking    Per [employer] Records   Gait disturbance    Hypertension    Low serum vitamin D    Stage 3b chronic kidney disease (HCC)    Tinnitus    Per [facility] Records   Past Surgical History:  Procedure Laterality Date   APPENDECTOMY     BACK SURGERY     CATARACT EXTRACTION, BILATERAL     CORNEAL TRANSPLANT     TOTAL ABDOMINAL HYSTERECTOMY W/ BILATERAL  SALPINGOOPHORECTOMY     TUBAL LIGATION      No Known Allergies  Outpatient Encounter Medications as of 08/28/2023  Medication Sig   amLODipine (NORVASC) 5 MG tablet Take 5 mg by mouth daily.   calcium carbonate (OSCAL) 1500 (600 Ca) MG TABS tablet Take 600 mg of elemental calcium by mouth 2 (two) times daily with a meal.   Cholecalciferol (VITAMIN D3) 50 MCG (2000 UT) TABS Take 1 tablet by mouth daily.   furosemide (LASIX) 40 MG tablet Take 40 mg by mouth daily.   lisinopril (ZESTRIL) 40 MG tablet Take 40 mg by mouth daily.   melatonin 5 MG TABS Take 5 mg by mouth at bedtime.   metoprolol succinate (TOPROL-XL) 25 MG 24 hr tablet Take 1 tablet by mouth in the morning and at bedtime.   polyethylene glycol (MIRALAX / GLYCOLAX) 17 g packet Take 17 g by mouth daily.   No facility-administered encounter medications on file as of 08/28/2023.    Review of Systems  Constitutional:  Negative for activity change, appetite change, fatigue and unexpected weight change.  HENT:  Negative for congestion and hearing loss.   Eyes: Negative.   Respiratory:  Negative for cough and shortness of breath.   Cardiovascular:  Negative for chest pain, palpitations and leg swelling.  Gastrointestinal:  Negative for abdominal  pain, constipation and diarrhea.  Genitourinary:  Negative for difficulty urinating and dysuria.  Musculoskeletal:  Negative for arthralgias and myalgias.  Skin:  Negative for color change and wound.  Neurological:  Negative for dizziness and weakness.  Psychiatric/Behavioral:  Negative for agitation, behavioral problems and confusion.      Immunization History  Administered Date(s) Administered   Influenza Split 10/18/2015   Influenza, High Dose Seasonal PF 09/10/2022   Influenza-Unspecified 09/24/2016   Moderna Covid-19 Fall Seasonal Vaccine 38yrs & older 03/04/2023   Moderna Covid-19 Vaccine Bivalent Booster 44yrs & up 04/12/2021, 08/17/2021   Moderna SARS-COV2 Booster Vaccination  10/10/2020   Moderna Sars-Covid-2 Vaccination 11/30/2019, 01/07/2020   PNEUMOCOCCAL CONJUGATE-20 09/06/2022   Pneumococcal Conjugate-13 10/30/2016, 12/24/2018   Pneumococcal Polysaccharide-23 11/29/2005   Pneumococcal-Unspecified 11/25/2005   Tdap 10/29/2011, 07/21/2022   Unspecified SARS-COV-2 Vaccination 08/22/2023   Zoster Recombinant(Shingrix) 02/25/2018, 06/15/2018   Zoster, Live 02/23/2006   Pertinent  Health Maintenance Due  Topic Date Due   INFLUENZA VACCINE  06/26/2023   DEXA SCAN  Completed      04/16/2021   12:12 PM 07/21/2022    6:58 PM 07/22/2022    1:12 AM 06/05/2023    2:44 PM  Fall Risk  Falls in the past year?    0  (RETIRED) Patient Fall Risk Level High fall risk Low fall risk Low fall risk    Functional Status Survey:    Vitals:   08/28/23 1122  BP: 138/76  Pulse: 63  Resp: 18  Temp: 97.6 F (36.4 C)  SpO2: 94%  Weight: 132 lb 6.4 oz (60.1 kg)  Height: 5\' 1"  (1.549 m)   Body mass index is 25.02 kg/m. Physical Exam Constitutional:      General: She is not in acute distress.    Appearance: She is well-developed. She is not diaphoretic.  HENT:     Head: Normocephalic and atraumatic.     Mouth/Throat:     Pharynx: No oropharyngeal exudate.  Eyes:     Conjunctiva/sclera: Conjunctivae normal.     Pupils: Pupils are equal, round, and reactive to light.  Cardiovascular:     Rate and Rhythm: Normal rate and regular rhythm.     Heart sounds: Normal heart sounds.  Pulmonary:     Effort: Pulmonary effort is normal.     Breath sounds: Normal breath sounds.  Abdominal:     General: Bowel sounds are normal.     Palpations: Abdomen is soft.  Musculoskeletal:     Cervical back: Normal range of motion and neck supple.     Right lower leg: No edema.     Left lower leg: No edema.  Skin:    General: Skin is warm and dry.  Neurological:     Mental Status: She is alert.     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     Labs reviewed: Recent Labs    07/31/23 0000 08/18/23 0000 08/25/23 0000  NA 136* 136* 136*  K 4.0 3.9 3.9  CL 98* 95* 97*  CO2 29* 34* 32*  BUN 20 32* 28*  CREATININE 1.0 1.2* 0.9  CALCIUM 8.8 8.8 9.0   Recent Labs    08/07/23 0000  AST 18  ALT 9  ALKPHOS 72  ALBUMIN 3.8   Recent Labs    01/27/23 0000 04/28/23 0000 07/31/23 0000  WBC 4.8 4.5 4.7  NEUTROABS 2,803.00 2,669.00 2,853.00  HGB 12.1  11.1* 11.3*  HCT 35* 33* 34*  PLT 259 276 273   No results found for: "TSH" No results found for: "HGBA1C" No results found for: "CHOL", "HDL", "LDLCALC", "LDLDIRECT", "TRIG", "CHOLHDL"  Significant Diagnostic Results in last 30 days:  No results found.  Assessment/Plan 1. Essential hypertension Blood pressure is well controlled, to continue current regimen  2. Stage 3b chronic kidney disease (HCC) -Chronic and stable Encourage proper hydration Follow metabolic panel Avoid nephrotoxic meds (NSAIDS)  3. Bilateral leg edema -improved at this time Continue lasix daily  4. Hyponatremia Stable on recent labs  5. Anemia, unspecified type Hgb stable on last labs, no signs of blood loss.    Janene Harvey. [facility] [phone]

## 2023-10-20 ENCOUNTER — Encounter: Payer: Self-pay | Admitting: Student

## 2023-10-20 ENCOUNTER — Non-Acute Institutional Stay (SKILLED_NURSING_FACILITY): Payer: Self-pay | Admitting: Student

## 2023-10-20 DIAGNOSIS — N1832 Chronic kidney disease, stage 3b: Secondary | ICD-10-CM

## 2023-10-20 DIAGNOSIS — R440 Auditory hallucinations: Secondary | ICD-10-CM

## 2023-10-20 DIAGNOSIS — D489 Neoplasm of uncertain behavior, unspecified: Secondary | ICD-10-CM

## 2023-10-20 DIAGNOSIS — R600 Localized edema: Secondary | ICD-10-CM

## 2023-10-20 DIAGNOSIS — I10 Essential (primary) hypertension: Secondary | ICD-10-CM

## 2023-10-20 DIAGNOSIS — M810 Age-related osteoporosis without current pathological fracture: Secondary | ICD-10-CM

## 2023-10-20 DIAGNOSIS — R296 Repeated falls: Secondary | ICD-10-CM

## 2023-10-20 NOTE — Progress Notes (Signed)
Location:  Other Nursing Home Room Number: [facility] [address] of Service:  SNF [phone]) Provider:  Ander Gaster, Benetta Spar, MD  Patient Care Team: Earnestine Mealing, MD as PCP - General (Family Medicine) Sharon Seller, NP as Nurse Practitioner (Geriatric Medicine)  Extended Emergency Contact Information Primary Emergency Contact: [facility] Address: [address]          [place], [place] [address] Darden Amber of [place] Home Phone: [phone] Mobile Phone: [phone] Relation: Son Secondary Emergency Contact: Hammerstrom,[name] Address: [address]          [place], [place] [address] Darden Amber of [place] Work Phone: [phone] Mobile Phone: [phone] Relation: Son  Code Status:  Full Code Goals of care: Advanced Directive information    08/28/2023   11:27 AM  Advanced Directives  Does Patient Have a Medical Advance Directive? Yes  Does patient want to make changes to medical advance directive? No - Patient declined     Chief Complaint  Patient presents with   Medical management of Chronic conditions.     HPI:  Pt is a 89 y.o. female seen today for medical management of chronic diseases.    Patient has had a growth on her left leg for a few months. No bleeding or irritation. Discussed it is likely skin cancer. She states she does not want a biopsy or to have it removed. She is fine for it to be with her until she dies. Doing well. No other concerns at this time.   Past Medical History:  Diagnosis Date   Anemia    Per [facility] Records   Carpal tunnel syndrome    Per [facility] Records   Difficulty walking    Per [facility] Records   Gait disturbance    Hypertension    Low serum vitamin D    Stage 3b chronic kidney disease (HCC)    Tinnitus    Per [facility] Records   Past Surgical History:  Procedure Laterality Date   APPENDECTOMY     BACK SURGERY     CATARACT EXTRACTION, BILATERAL     CORNEAL TRANSPLANT     TOTAL ABDOMINAL HYSTERECTOMY W/  BILATERAL SALPINGOOPHORECTOMY     TUBAL LIGATION      No Known Allergies  Outpatient Encounter Medications as of 10/20/2023  Medication Sig   amLODipine (NORVASC) 5 MG tablet Take 5 mg by mouth daily.   calcium carbonate (OSCAL) 1500 (600 Ca) MG TABS tablet Take 600 mg of elemental calcium by mouth 2 (two) times daily with a meal.   Cholecalciferol (VITAMIN D3) 50 MCG (2000 UT) TABS Take 1 tablet by mouth daily.   furosemide (LASIX) 40 MG tablet Take 40 mg by mouth daily.   lisinopril (ZESTRIL) 40 MG tablet Take 40 mg by mouth daily.   melatonin 5 MG TABS Take 5 mg by mouth at bedtime.   metoprolol succinate (TOPROL-XL) 25 MG 24 hr tablet Take 1 tablet by mouth in the morning and at bedtime.   polyethylene glycol (MIRALAX / GLYCOLAX) 17 g packet Take 17 g by mouth daily.   No facility-administered encounter medications on file as of 10/20/2023.    Review of Systems  Immunization History  Administered Date(s) Administered   Influenza Split 10/18/2015   Influenza, High Dose Seasonal PF 09/10/2022   Influenza-Unspecified 09/24/2016   Moderna Covid-19 Fall Seasonal Vaccine 62yrs & older 03/04/2023   Moderna Covid-19 Vaccine Bivalent Booster 40yrs & up 04/12/2021, 08/17/2021   Moderna SARS-COV2 Booster Vaccination  10/10/2020   Moderna Sars-Covid-2 Vaccination 11/30/2019, 01/07/2020   PNEUMOCOCCAL CONJUGATE-20 09/06/2022   Pneumococcal Conjugate-13 10/30/2016, 12/24/2018   Pneumococcal Polysaccharide-23 11/29/2005   Pneumococcal-Unspecified 11/25/2005   Tdap 10/29/2011, 07/21/2022   Unspecified SARS-COV-2 Vaccination 08/22/2023   Zoster Recombinant(Shingrix) 02/25/2018, 06/15/2018   Zoster, Live 02/23/2006   Pertinent  Health Maintenance Due  Topic Date Due   INFLUENZA VACCINE  06/26/2023   DEXA SCAN  Completed      04/16/2021   12:12 PM 07/21/2022    6:58 PM 07/22/2022    1:12 AM 06/05/2023    2:44 PM  Fall Risk  Falls in the past year?    0  (RETIRED) Patient Fall Risk  Level High fall risk Low fall risk Low fall risk    Functional Status Survey:    Vitals:   10/20/23 2113  BP: 138/79  Pulse: 62  Resp: 18  Temp: 98.2 F (36.8 C)  SpO2: 94%  Weight: 128 lb (58.1 kg)  Height: 5\' 3"  (1.6 m)   Body mass index is 22.67 kg/m. Physical Exam Constitutional:      Appearance: Normal appearance. She is normal weight.  Cardiovascular:     Rate and Rhythm: Normal rate.     Pulses: Normal pulses.  Pulmonary:     Effort: Pulmonary effort is normal.  Skin:    General: Skin is warm.     Comments: Left leg with 0.6cm pearly pink papule with central core.   Neurological:     Mental Status: She is alert and oriented to person, place, and time.     Labs reviewed: Recent Labs    07/31/23 0000 08/18/23 0000 08/25/23 0000  NA 136* 136* 136*  K 4.0 3.9 3.9  CL 98* 95* 97*  CO2 29* 34* 32*  BUN 20 32* 28*  CREATININE 1.0 1.2* 0.9  CALCIUM 8.8 8.8 9.0   Recent Labs    08/07/23 0000  AST 18  ALT 9  ALKPHOS 72  ALBUMIN 3.8   Recent Labs    01/27/23 0000 04/28/23 0000 07/31/23 0000  WBC 4.8 4.5 4.7  NEUTROABS 2,803.00 2,669.00 2,853.00  HGB 12.1 11.1* 11.3*  HCT 35* 33* 34*  PLT 259 276 273   No results found for: "TSH" No results found for: "HGBA1C" No results found for: "CHOL", "HDL", "LDLCALC", "LDLDIRECT", "TRIG", "CHOLHDL"  Significant Diagnostic Results in last 30 days:  No results found.  Assessment/Plan Neoplasm of uncertain behavior  Essential hypertension - Plan: Full code  Stage 3b chronic kidney disease (HCC)  Bilateral leg edema  Age-related osteoporosis without current pathological fracture  Auditory hallucinations  Frequent falls Patient with a growth no change in size in the last 2 months. Declines biopsy at this time, continue GOC conversations. BP well controlled at this time with amlodipine, lasix, metoprolol. Leg edema improved since starting diuretic. Continue calcium and vitamin d for osteoporosis.  Patient preference to defer treatment for hallucinations.  Family/ staff Communication: nursing  Labs/tests ordered:  BMP quarterly w/ magnesium.

## 2023-10-30 LAB — BASIC METABOLIC PANEL
BUN: 30 — AB (ref 4–21)
CO2: 33 — AB (ref 13–22)
Chloride: 94 — AB (ref 99–108)
Creatinine: 1.1 (ref 0.5–1.1)
Glucose: 64
Potassium: 4.1 meq/L (ref 3.5–5.1)
Sodium: 135 — AB (ref 137–147)

## 2023-10-30 LAB — CBC AND DIFFERENTIAL
HCT: 36 (ref 36–46)
Hemoglobin: 12.1 (ref 12.0–16.0)
Neutrophils Absolute: 3106
Platelets: 284 10*3/uL (ref 150–400)
WBC: 5.3

## 2023-10-30 LAB — COMPREHENSIVE METABOLIC PANEL
Calcium: 9.7 (ref 8.7–10.7)
eGFR: 49

## 2023-10-30 LAB — CBC: RBC: 3.71 — AB (ref 3.87–5.11)

## 2023-11-24 ENCOUNTER — Encounter: Payer: Self-pay | Admitting: Adult Health

## 2023-11-24 ENCOUNTER — Non-Acute Institutional Stay (SKILLED_NURSING_FACILITY): Payer: Self-pay | Admitting: Adult Health

## 2023-11-24 DIAGNOSIS — R600 Localized edema: Secondary | ICD-10-CM

## 2023-11-24 DIAGNOSIS — N1831 Chronic kidney disease, stage 3a: Secondary | ICD-10-CM

## 2023-11-24 DIAGNOSIS — J9600 Acute respiratory failure, unspecified whether with hypoxia or hypercapnia: Secondary | ICD-10-CM

## 2023-11-24 NOTE — Progress Notes (Signed)
Location:  Other [facility]) Nursing Home Room Number: [address] [facility]) Place of Service:  SNF (31) Provider:  Kenard Gower, DNP, FNP-BC  Patient Care Team: Earnestine Mealing, MD as PCP - General (Family Medicine) Sharon Seller, NP as Nurse Practitioner (Geriatric Medicine)  Extended Emergency Contact Information Primary Emergency Contact: [facility] Address: [address]          [place], [place] [address] Darden Amber of [place] Home Phone: [phone] Mobile Phone: [phone] Relation: Son Secondary Emergency Contact: Ruda,[name] Address: [address]          [place], [place] [address] Darden Amber of [place] Work Phone: [phone] Mobile Phone: [phone] Relation: Son  Code Status:  Full Code  Goals of care: Advanced Directive information    11/24/2023    3:41 PM  Advanced Directives  Does Patient Have a Medical Advance Directive? No  Would patient like information on creating a medical advance directive? No - Patient declined     Chief Complaint  Patient presents with   Acute Visit     shortness of breath when walking    HPI:  Pt is a 89 y.o. female seen today for an acute visit regarding shortness of breath when walking. She is a resident of [facility]. She was seen in her room today. She stated that she has SOB when walking last week. Today, she had restorative therapy and did not experience SOB. She takes Furosemide 40 mg daily for lower extremity edema. She has BLE 2+edema. O2 sat on room air 96%.   Past Medical History:  Diagnosis Date   Anemia    Per [facility] Records   Carpal tunnel syndrome    Per [facility] Records   Difficulty walking    Per [facility] Records   Gait disturbance    Hypertension    Low serum vitamin D    Stage 3b chronic kidney disease (HCC)    Tinnitus    Per [facility] Records   Past Surgical History:  Procedure Laterality Date   APPENDECTOMY     BACK SURGERY     CATARACT  EXTRACTION, BILATERAL     CORNEAL TRANSPLANT     TOTAL ABDOMINAL HYSTERECTOMY W/ BILATERAL SALPINGOOPHORECTOMY     TUBAL LIGATION      No Known Allergies  Outpatient Encounter Medications as of 11/24/2023  Medication Sig   amLODipine (NORVASC) 5 MG tablet Take 5 mg by mouth daily.   calcium carbonate (OSCAL) 1500 (600 Ca) MG TABS tablet Take 600 mg of elemental calcium by mouth 2 (two) times daily with a meal.   Cholecalciferol (VITAMIN D3) 50 MCG (2000 UT) TABS Take 1 tablet by mouth daily.   furosemide (LASIX) 40 MG tablet Take 40 mg by mouth daily.   lisinopril (ZESTRIL) 40 MG tablet Take 40 mg by mouth daily.   melatonin 5 MG TABS Take 5 mg by mouth at bedtime.   metoprolol succinate (TOPROL-XL) 25 MG 24 hr tablet Take 1 tablet by mouth in the morning and at bedtime.   polyethylene glycol (MIRALAX / GLYCOLAX) 17 g packet Take 17 g by mouth daily.   Sunscreen SPF50 LOTN Apply 1 Application topically every 2 (two) hours as needed (Sunburn Prevention).   No facility-administered encounter medications on file as of 11/24/2023.    Review of Systems  Constitutional:  Negative for appetite change, chills, fatigue and fever.  HENT:  Negative for congestion, hearing loss, rhinorrhea and sore throat.  Eyes: Negative.   Respiratory:  Positive for shortness of breath. Negative for cough and wheezing.   Cardiovascular:  Positive for leg swelling. Negative for chest pain and palpitations.  Gastrointestinal:  Negative for abdominal pain, constipation, diarrhea, nausea and vomiting.  Genitourinary:  Negative for dysuria.  Musculoskeletal:  Negative for arthralgias, back pain and myalgias.  Skin:  Negative for color change, rash and wound.  Neurological:  Negative for dizziness, weakness and headaches.  Psychiatric/Behavioral:  Negative for behavioral problems. The patient is not nervous/anxious.        Immunization History  Administered Date(s) Administered   Fluad Quad(high Dose 65+)  09/17/2023   Influenza Split 10/18/2015   Influenza, High Dose Seasonal PF 09/10/2022   Influenza-Unspecified 09/24/2016   Moderna Covid-19 Fall Seasonal Vaccine 59yrs & older 03/04/2023   Moderna Covid-19 Vaccine Bivalent Booster 26yrs & up 04/12/2021, 08/17/2021   Moderna SARS-COV2 Booster Vaccination 10/10/2020   Moderna Sars-Covid-2 Vaccination 11/30/2019, 01/07/2020   PNEUMOCOCCAL CONJUGATE-20 09/06/2022   Pneumococcal Conjugate-13 10/30/2016, 12/24/2018   Pneumococcal Polysaccharide-23 11/29/2005   Pneumococcal-Unspecified 11/25/2005   Tdap 10/29/2011, 07/21/2022   Unspecified SARS-COV-2 Vaccination 08/22/2023   Zoster Recombinant(Shingrix) 02/25/2018, 06/15/2018   Zoster, Live 02/23/2006   Pertinent  Health Maintenance Due  Topic Date Due   INFLUENZA VACCINE  Completed   DEXA SCAN  Completed      04/16/2021   12:12 PM 07/21/2022    6:58 PM 07/22/2022    1:12 AM 06/05/2023    2:44 PM 10/20/2023    9:27 PM  Fall Risk  Falls in the past year?    0 1  Was there an injury with Fall?     0  Fall Risk Category Calculator     2  (RETIRED) Patient Fall Risk Level High fall risk Low fall risk Low fall risk    Patient at Risk for Falls Due to     History of fall(s)  Fall risk Follow up     Falls evaluation completed     Vitals:   11/24/23 1534  BP: 125/72  Pulse: 61  Resp: 16  Temp: 97.6 F (36.4 C)  SpO2: 94%  Weight: 131 lb 11.2 oz (59.7 kg)  Height: 5\' 3"  (1.6 m)   Body mass index is 23.33 kg/m.  Physical Exam Constitutional:      Appearance: Normal appearance.  HENT:     Head: Normocephalic and atraumatic.     Nose: Nose normal.     Mouth/Throat:     Mouth: Mucous membranes are moist.  Eyes:     Conjunctiva/sclera: Conjunctivae normal.  Cardiovascular:     Rate and Rhythm: Normal rate and regular rhythm.  Pulmonary:     Effort: Pulmonary effort is normal.     Breath sounds: Normal breath sounds.  Abdominal:     General: Bowel sounds are normal.      Palpations: Abdomen is soft.  Musculoskeletal:     Cervical back: Normal range of motion.     Right lower leg: Edema present.     Left lower leg: Edema present.     Comments: Right hand with arthritic deformities, walks with walker, BLE 2+edema  Skin:    General: Skin is warm and dry.  Neurological:     Mental Status: She is alert and oriented to person, place, and time.  Psychiatric:        Mood and Affect: Mood normal.        Behavior: Behavior normal.  Thought Content: Thought content normal.        Judgment: Judgment normal.       Labs reviewed: Recent Labs    08/18/23 0000 08/25/23 0000 10/30/23 0000  NA 136* 136* 135*  K 3.9 3.9 4.1  CL 95* 97* 94*  CO2 34* 32* 33*  BUN 32* 28* 30*  CREATININE 1.2* 0.9 1.1  CALCIUM 8.8 9.0 9.7   Recent Labs    08/07/23 0000  AST 18  ALT 9  ALKPHOS 72  ALBUMIN 3.8   Recent Labs    04/28/23 0000 07/31/23 0000 10/30/23 0000  WBC 4.5 4.7 5.3  NEUTROABS 2,669.00 2,853.00 3,106.00  HGB 11.1* 11.3* 12.1  HCT 33* 34* 36  PLT 276 273 284   No results found for: "TSH" No results found for: "HGBA1C" No results found for: "CHOL", "HDL", "LDLCALC", "LDLDIRECT", "TRIG", "CHOLHDL"  Significant Diagnostic Results in last 30 days:  No results found.  Assessment/Plan  1. Acute respiratory failure, unspecified whether with hypoxia or hypercapnia (HCC) (Primary) -  O2 sat 96% on room air -  stable -  monitor O2 sat  2. Bilateral leg edema -  continue Furosemide -  elevate legs at night  3. Stage 3a chronic kidney disease (HCC) Lab Results  Component Value Date   NA 135 (A) 10/30/2023   K 4.1 10/30/2023   CO2 33 (A) 10/30/2023   GLUCOSE 122 (H) 07/21/2022   BUN 30 (A) 10/30/2023   CREATININE 1.1 10/30/2023   CALCIUM 9.7 10/30/2023   EGFR 49 10/30/2023   GFRNONAA 53 (L) 07/21/2022    -  monitor    Family/ staff Communication: Discussed plan of care with resident and charge nurse  Labs/tests ordered:  BNP  and BMP    Kenard Gower, DNP, MSN, FNP-BC [facility] [phone] (Monday-Friday 8:00 a.m. - 5:00 p.m.) [phone] (after hours)

## 2023-11-27 ENCOUNTER — Encounter: Payer: Self-pay | Admitting: Nurse Practitioner

## 2023-11-27 ENCOUNTER — Non-Acute Institutional Stay (SKILLED_NURSING_FACILITY): Payer: Self-pay | Admitting: Nurse Practitioner

## 2023-11-27 DIAGNOSIS — M810 Age-related osteoporosis without current pathological fracture: Secondary | ICD-10-CM | POA: Diagnosis not present

## 2023-11-27 DIAGNOSIS — N1832 Chronic kidney disease, stage 3b: Secondary | ICD-10-CM

## 2023-11-27 DIAGNOSIS — I10 Essential (primary) hypertension: Secondary | ICD-10-CM

## 2023-11-27 DIAGNOSIS — R600 Localized edema: Secondary | ICD-10-CM | POA: Diagnosis not present

## 2023-11-27 DIAGNOSIS — K5904 Chronic idiopathic constipation: Secondary | ICD-10-CM

## 2023-11-27 LAB — COMPREHENSIVE METABOLIC PANEL: eGFR: 49

## 2023-11-27 LAB — BASIC METABOLIC PANEL
BUN: 31 — AB (ref 4–21)
CO2: 9 — AB (ref 13–22)
Chloride: 98 — AB (ref 99–108)
Creatinine: 1.1 (ref 0.5–1.1)
Glucose: 79
Potassium: 4 meq/L (ref 3.5–5.1)
Sodium: 138 (ref 137–147)

## 2023-11-27 NOTE — Progress Notes (Signed)
 Location:  Other [facility].  Nursing Home Room Number: [facility] [place] [facility] [address] Place of Service:  SNF (31) Brittany An, NP  PCP: Brittany Fine, MD  Patient Care Team: Brittany Fine, MD as PCP - General (Family Medicine) Bryant Brittany POUR, NP as Nurse Practitioner (Geriatric Medicine)  Extended Emergency Contact Information Primary Emergency Contact: [facility] Address: [address]          [place], [place] [address] [place] Home Phone: [phone] Mobile Phone: [phone] Relation: Son Secondary Emergency Contact: Brittany Bryant Address: [address]          [place], [place] [address] [place] Work Phone: [phone] Mobile Phone: [phone] Relation: Son  Goals of care: Advanced Directive information    11/27/2023    9:27 AM  Advanced Directives  Does Patient Have a Medical Advance Directive? No  Would patient like information on creating a medical advance directive? No - Patient declined     Chief Complaint  Patient presents with   Medical Management of Chronic Issues    Medical Management of Chronic Issues.     HPI:  Pt is a 89 y.o. female seen today for medical management of chronic disease.  Pt with hx of of htn, constipation, CKD, vit d, hyponatermia.   She had some shortness of breath last month but this has resolved. She states she has been doing well.   She has been eating well and remains active.  She is happy she has time to sit and read her books.  Nursing has no concerns at this time.   Past Medical History:  Diagnosis Date   Anemia    Per [facility] Records   Carpal tunnel syndrome    Per [facility] Records   Difficulty walking    Per [facility] Records   Gait disturbance    Hypertension    Low serum vitamin D    Stage 3b chronic kidney disease (HCC)    Tinnitus    Per [facility] Records   Past Surgical History:  Procedure Laterality Date   APPENDECTOMY     BACK SURGERY     CATARACT EXTRACTION,  BILATERAL     CORNEAL TRANSPLANT     TOTAL ABDOMINAL HYSTERECTOMY W/ BILATERAL SALPINGOOPHORECTOMY     TUBAL LIGATION      No Known Allergies  Outpatient Encounter Medications as of 11/27/2023  Medication Sig   amLODipine  (NORVASC ) 5 MG tablet Take 5 mg by mouth daily.   calcium carbonate (OSCAL) 1500 (600 Ca) MG TABS tablet Take 600 mg of elemental calcium by mouth 2 (two) times daily with a meal.   Cholecalciferol (VITAMIN D3) 50 MCG (2000 UT) TABS Take 1 tablet by mouth daily.   furosemide (LASIX) 40 MG tablet Take 40 mg by mouth daily.   lisinopril  (ZESTRIL ) 40 MG tablet Take 40 mg by mouth daily.   melatonin 5 MG TABS Take 5 mg by mouth at bedtime.   metoprolol succinate (TOPROL-XL) 25 MG 24 hr tablet Take 1 tablet by mouth in the morning and at bedtime.   polyethylene glycol (MIRALAX / GLYCOLAX) 17 g packet Take 17 g by mouth daily.   Sunscreen SPF50 LOTN Apply 1 Application topically every 2 (two) hours as needed (Sunburn Prevention).   No facility-administered encounter medications on file as of 11/27/2023.    Review of Systems  Constitutional:  Negative for activity change, appetite change, fatigue and unexpected weight change.  HENT:  Negative for congestion and hearing loss.  Eyes: Negative.   Respiratory:  Negative for cough and shortness of breath.   Cardiovascular:  Negative for chest pain, palpitations and leg swelling.  Gastrointestinal:  Negative for abdominal pain, constipation and diarrhea.  Genitourinary:  Negative for difficulty urinating and dysuria.  Musculoskeletal:  Negative for arthralgias and myalgias.  Skin:  Negative for color change and wound.  Neurological:  Negative for dizziness and weakness.  Psychiatric/Behavioral:  Negative for agitation, behavioral problems and confusion.      Immunization History  Administered Date(s) Administered   Fluad Quad(high Dose 65+) 09/17/2023   Influenza Split 10/18/2015   Influenza, High Dose Seasonal PF  09/10/2022   Influenza-Unspecified 09/24/2016   Moderna Covid-19 Fall Seasonal Vaccine 25yrs & older 03/04/2023   Moderna Covid-19 Vaccine Bivalent Booster 32yrs & up 04/12/2021, 08/17/2021   Moderna SARS-COV2 Booster Vaccination 10/10/2020   Moderna Sars-Covid-2 Vaccination 11/30/2019, 01/07/2020   PNEUMOCOCCAL CONJUGATE-20 09/06/2022   Pneumococcal Conjugate-13 10/30/2016, 12/24/2018   Pneumococcal Polysaccharide-23 11/29/2005   Pneumococcal-Unspecified 11/25/2005   Tdap 10/29/2011, 07/21/2022   Unspecified SARS-COV-2 Vaccination 08/22/2023   Zoster Recombinant(Shingrix) 02/25/2018, 06/15/2018   Zoster, Live 02/23/2006   Pertinent  Health Maintenance Due  Topic Date Due   INFLUENZA VACCINE  Completed   DEXA SCAN  Completed      04/16/2021   12:12 PM 07/21/2022    6:58 PM 07/22/2022    1:12 AM 06/05/2023    2:44 PM 10/20/2023    9:27 PM  Fall Risk  Falls in the past year?    0 1  Was there Bryant injury with Fall?     0  Fall Risk Category Calculator     2  (RETIRED) Patient Fall Risk Level High fall risk Low fall risk Low fall risk    Patient at Risk for Falls Due to     History of fall(s)  Fall risk Follow up     Falls evaluation completed   Functional Status Survey:    Vitals:   11/27/23 0923 11/27/23 0929  BP: (!) 142/78 125/72  Pulse: 65   Resp: 16   Temp: 97.6 F (36.4 C)   SpO2: 94%   Weight: 130 lb 3.2 oz (59.1 kg)   Height: 5' 3 (1.6 m)    Body mass index is 23.06 kg/m. Physical Exam Constitutional:      General: She is not in acute distress.    Appearance: She is well-developed. She is not diaphoretic.  HENT:     Head: Normocephalic and atraumatic.     Mouth/Throat:     Pharynx: No oropharyngeal exudate.  Eyes:     Conjunctiva/sclera: Conjunctivae normal.     Pupils: Pupils are equal, round, and reactive to light.  Cardiovascular:     Rate and Rhythm: Normal rate and regular rhythm.     Heart sounds: Normal heart sounds.  Pulmonary:     Effort:  Pulmonary effort is normal.     Breath sounds: Normal breath sounds.  Abdominal:     General: Bowel sounds are normal.     Palpations: Abdomen is soft.  Musculoskeletal:     Cervical back: Normal range of motion and neck supple.     Right lower leg: No edema.     Left lower leg: No edema.  Skin:    General: Skin is warm and dry.  Neurological:     Mental Status: She is alert.     Gait: Gait abnormal (uses walker for mobility).  Psychiatric:  Mood and Affect: Mood normal.     Labs reviewed: Recent Labs    08/18/23 0000 08/25/23 0000 10/30/23 0000  NA 136* 136* 135*  K 3.9 3.9 4.1  CL 95* 97* 94*  CO2 34* 32* 33*  BUN 32* 28* 30*  CREATININE 1.2* 0.9 1.1  CALCIUM 8.8 9.0 9.7   Recent Labs    08/07/23 0000  AST 18  ALT 9  ALKPHOS 72  ALBUMIN 3.8   Recent Labs    04/28/23 0000 07/31/23 0000 10/30/23 0000  WBC 4.5 4.7 5.3  NEUTROABS 2,669.00 2,853.00 3,106.00  HGB 11.1* 11.3* 12.1  HCT 33* 34* 36  PLT 276 273 284   No results found for: TSH No results found for: HGBA1C No results found for: CHOL, HDL, LDLCALC, LDLDIRECT, TRIG, CHOLHDL  Significant Diagnostic Results in last 30 days:  No results found.  Assessment/Plan 1. Essential hypertension (Primary) -Blood pressure well controlled, goal bp <140/90 Continue current medications and dietary modifications follow metabolic panel  2. Stage 3b chronic kidney disease (HCC) -Chronic and stable Encourage proper hydration Follow metabolic panel Avoid nephrotoxic meds (NSAIDS)   3. Age-related osteoporosis without current pathological fracture Continue on cal and vit d with weight bearing exercises  4. Bilateral leg edema -stable at this time --encouraged to elevate legs above level of heart as tolerates, low sodium diet, compression hose as tolerates (on in am, off in pm)  5. Chronic idiopathic constipation Controlled on current regimen.     Brittany Bryant K. Caro BODILY [facility] [phone]

## 2024-01-26 LAB — CBC AND DIFFERENTIAL
HCT: 33 — AB (ref 36–46)
Hemoglobin: 11.2 — AB (ref 12.0–16.0)
Neutrophils Absolute: 2426
Platelets: 236 10*3/uL (ref 150–400)
WBC: 4.9

## 2024-01-26 LAB — BASIC METABOLIC PANEL
BUN: 30 — AB (ref 4–21)
CO2: 33 — AB (ref 13–22)
Chloride: 99 (ref 99–108)
Creatinine: 1.1 (ref 0.5–1.1)
Glucose: 74
Potassium: 3.8 meq/L (ref 3.5–5.1)
Sodium: 137 (ref 137–147)

## 2024-01-26 LAB — CBC: RBC: 3.31 — AB (ref 3.87–5.11)

## 2024-01-26 LAB — COMPREHENSIVE METABOLIC PANEL: Calcium: 8.9 (ref 8.7–10.7)

## 2024-02-04 ENCOUNTER — Non-Acute Institutional Stay (SKILLED_NURSING_FACILITY): Payer: Self-pay | Admitting: Student

## 2024-02-04 ENCOUNTER — Encounter: Payer: Self-pay | Admitting: Student

## 2024-02-04 DIAGNOSIS — N1832 Chronic kidney disease, stage 3b: Secondary | ICD-10-CM | POA: Diagnosis not present

## 2024-02-04 DIAGNOSIS — I351 Nonrheumatic aortic (valve) insufficiency: Secondary | ICD-10-CM | POA: Diagnosis not present

## 2024-02-04 DIAGNOSIS — I340 Nonrheumatic mitral (valve) insufficiency: Secondary | ICD-10-CM | POA: Diagnosis not present

## 2024-02-04 DIAGNOSIS — I361 Nonrheumatic tricuspid (valve) insufficiency: Secondary | ICD-10-CM

## 2024-02-04 DIAGNOSIS — I10 Essential (primary) hypertension: Secondary | ICD-10-CM | POA: Diagnosis not present

## 2024-02-04 NOTE — Progress Notes (Signed)
 Location:  Other [facility].  Nursing Home Room Number: [facility] [address] Place of Service:  SNF [phone]) Provider:  Earnestine Mealing, MD  Patient Care Team: Earnestine Mealing, MD as PCP - General (Family Medicine) Sharon Seller, NP as Nurse Practitioner (Geriatric Medicine)  Extended Emergency Contact Information Primary Emergency Contact: [facility] Address: [address]          [place], [place] [address] [place] Home Phone: [phone] Mobile Phone: [phone] Relation: Son Secondary Emergency Contact: Loudin,[name] Address: [address]          [place], [place] [address] [place] of [place] Work Phone: [phone] Mobile Phone: [phone] Relation: Son  Code Status:  Full Code.  Goals of care: Advanced Directive information    02/04/2024   10:22 AM  Advanced Directives  Does Patient Have a Medical Advance Directive? No  Would patient like information on creating a medical advance directive? No - Patient declined     Chief Complaint  Patient presents with   Medical Management of Chronic Issues    Medical management of Chronic Issues.     HPI:  Pt is a 89 y.o. female seen today for medical management of chronic diseases.    The patient is a 89 year old with heart issues who presents with concerns about breathing difficulties.  She experiences occasional breathing difficulties, particularly when walking, which sometimes necessitates stopping to catch her breath. Today has been a good day, allowing her to walk outside without issues. She wants to maintain her independence and continue using a rolling walker for mobility. No chest pain or shortness of breath during the review of symptoms.  She mentions a history of heart issues, specifically that her heart is not squeezing as well as it used to, which she attributes to old age. She recalls having an echocardiogram in the past. She is aware that changes in her weight, possibly due to fluid  retention, can affect her breathing and is monitored daily for this reason.  She discusses her end-of-life wishes, expressing a preference to avoid hospitalization if she becomes ill, but is open to surgical intervention if necessary, such as for a broken leg. She does not wish to undergo chest compressions or intubation if found without a pulse, preferring to pass peacefully. She mentions her son, a Education officer, community in [place], as someone who helps make decisions for her.  In terms of nutrition, she reports a tendency to overeat but maintain a diet that includes vegetables and fruit. She has no concerns about bowel movements. Physical Exam  Past Medical History:  Diagnosis Date   Anemia    Per [facility] Records   Carpal tunnel syndrome    Per [facility] Records   Difficulty walking    Per [facility] Records   Gait disturbance    Hypertension    Low serum vitamin D    Stage 3b chronic kidney disease (HCC)    Tinnitus    Per [facility] Records   Past Surgical History:  Procedure Laterality Date   APPENDECTOMY     BACK SURGERY     CATARACT EXTRACTION, BILATERAL     CORNEAL TRANSPLANT     TOTAL ABDOMINAL HYSTERECTOMY W/ BILATERAL SALPINGOOPHORECTOMY     TUBAL LIGATION      No Known Allergies  Outpatient Encounter Medications as of 02/04/2024  Medication Sig   amLODipine (NORVASC) 5 MG tablet Take 5 mg by mouth daily.   calcium carbonate (OSCAL) 1500 (600 Ca) MG  TABS tablet Take 600 mg of elemental calcium by mouth 2 (two) times daily with a meal.   Cholecalciferol (VITAMIN D3) 50 MCG (2000 UT) TABS Take 1 tablet by mouth daily.   furosemide (LASIX) 40 MG tablet Take 40 mg by mouth daily.   lisinopril (ZESTRIL) 40 MG tablet Take 40 mg by mouth daily.   melatonin 5 MG TABS Take 5 mg by mouth at bedtime.   metoprolol succinate (TOPROL-XL) 25 MG 24 hr tablet Take 1 tablet by mouth in the morning and at bedtime.   polyethylene glycol (MIRALAX / GLYCOLAX) 17 g packet Take 17 g by mouth  daily.   Sunscreen SPF50 LOTN Apply 1 Application topically every 2 (two) hours as needed (Sunburn Prevention).   No facility-administered encounter medications on file as of 02/04/2024.    Review of Systems  Immunization History  Administered Date(s) Administered   Fluad Quad(high Dose 65+) 09/17/2023   Influenza Split 10/18/2015   Influenza, High Dose Seasonal PF 09/10/2022   Influenza-Unspecified 09/24/2016   Moderna Covid-19 Fall Seasonal Vaccine 7yrs & older 03/04/2023   Moderna Covid-19 Vaccine Bivalent Booster 34yrs & up 04/12/2021, 08/17/2021   Moderna SARS-COV2 Booster Vaccination 10/10/2020   Moderna Sars-Covid-2 Vaccination 11/30/2019, 01/07/2020   PNEUMOCOCCAL CONJUGATE-20 09/06/2022   Pneumococcal Conjugate-13 10/30/2016, 12/24/2018   Pneumococcal Polysaccharide-23 11/29/2005   Pneumococcal-Unspecified 11/25/2005   Tdap 10/29/2011, 07/21/2022   Unspecified SARS-COV-2 Vaccination 08/22/2023   Zoster Recombinant(Shingrix) 02/25/2018, 06/15/2018   Zoster, Live 02/23/2006   Pertinent  Health Maintenance Due  Topic Date Due   INFLUENZA VACCINE  Completed   DEXA SCAN  Completed      04/16/2021   12:12 PM 07/21/2022    6:58 PM 07/22/2022    1:12 AM 06/05/2023    2:44 PM 10/20/2023    9:27 PM  Fall Risk  Falls in the past year?    0 1  Was there an injury with Fall?     0  Fall Risk Category Calculator     2  (RETIRED) Patient Fall Risk Level High fall risk Low fall risk Low fall risk    Patient at Risk for Falls Due to     History of fall(s)  Fall risk Follow up     Falls evaluation completed   Functional Status Survey:    Vitals:   02/04/24 1014  BP: (!) 127/59  Pulse: (!) 59  Resp: 18  Temp: (!) 97.5 F (36.4 C)  SpO2: 95%  Weight: 130 lb 3.2 oz (59.1 kg)  Height: 5\' 3"  (1.6 m)   Body mass index is 23.06 kg/m. Physical Exam CHEST: Lungs clear to auscultation bilaterally. CARDIOVASCULAR: Regular rhythm, mumur present ABDOMEN: Abdomen soft and  undistended. EXTREMITIES: No edema.  Labs reviewed: Recent Labs    08/25/23 0000 10/30/23 0000 11/27/23 0000 01/26/24 0000  NA 136* 135* 138 137  K 3.9 4.1 4.0 3.8  CL 97* 94* 98* 99  CO2 32* 33* 9* 33*  BUN 28* 30* 31* 30*  CREATININE 0.9 1.1 1.1 1.1  CALCIUM 9.0 9.7  --  8.9   Recent Labs    08/07/23 0000  AST 18  ALT 9  ALKPHOS 72  ALBUMIN 3.8   Recent Labs    07/31/23 0000 10/30/23 0000 01/26/24 0000  WBC 4.7 5.3 4.9  NEUTROABS 2,853.00 3,106.00 2,426.00  HGB 11.3* 12.1 11.2*  HCT 34* 36 33*  PLT 273 284 236   No results found for: "TSH" No results found for: "HGBA1C"  No results found for: "CHOL", "HDL", "LDLCALC", "LDLDIRECT", "TRIG", "CHOLHDL"  Significant Diagnostic Results in last 30 days:  No results found.  Assessment/Plan There are no diagnoses linked to this encounter. Valvular disease:  Mitral valve, Tricuspid valve, and aortic valve disease contributes to intermittent dyspnea and the need to stop while walking. Management includes monitoring daily weight to assess fluid status, as fluctuations may indicate fluid retention. Increased salt intake or fluid consumption can exacerbate symptoms, potentially requiring an additional diuretic dose to manage fluid overload. She understands the importance of vigilant weight monitoring to prevent exacerbations. - Monitor daily weight to assess fluid status - Adjust diuretic dosage based on weight changes and symptoms  Advance Care Planning  Goals of Care She wishes to avoid aggressive interventions in the event of cardiac arrest, preferring to pass peacefully without chest compressions. She prefers to remain at her current residence rather than being hospitalized unless emergent surgical intervention is necessary, such as for a fracture. She has designated her son, a Education officer, community, as a Oceanographer. She prefers to avoid hospitalization for illness but is open to surgical intervention if necessary for  injury. - Document preference for no chest compressions in the event of cardiac arrest - Communicate wishes to her son and ensure he is informed of the care plan - Ensure care team is aware of preference to remain at current residence unless emergent surgical intervention is required     Family/ staff Communication: nursing, son  Labs/tests ordered:  BMP, CBC, ordered q37mo I spent greater than 30  minutes for the care of this patient in face to face time, chart review, clinical documentation, patient education. I spent an additional 16 minutes discussing goals of care and advanced care planning.

## 2024-03-08 NOTE — Addendum Note (Signed)
 Addended by: Sindy Mccune on: 03/08/2024 05:16 PM   Modules accepted: Orders

## 2024-03-18 LAB — BASIC METABOLIC PANEL WITH GFR
BUN: 27 — AB (ref 4–21)
CO2: 32 — AB (ref 13–22)
Chloride: 98 — AB (ref 99–108)
Creatinine: 1.4 — AB (ref 0.5–1.1)
Potassium: 3.8 meq/L (ref 3.5–5.1)
Sodium: 137 (ref 137–147)

## 2024-03-18 LAB — COMPREHENSIVE METABOLIC PANEL WITH GFR
Calcium: 8.8 (ref 8.7–10.7)
Globulin: 77

## 2024-03-22 ENCOUNTER — Encounter: Payer: Self-pay | Admitting: Student

## 2024-03-22 ENCOUNTER — Non-Acute Institutional Stay (SKILLED_NURSING_FACILITY): Payer: Self-pay | Admitting: Student

## 2024-03-22 DIAGNOSIS — Z993 Dependence on wheelchair: Secondary | ICD-10-CM

## 2024-03-22 DIAGNOSIS — I340 Nonrheumatic mitral (valve) insufficiency: Secondary | ICD-10-CM

## 2024-03-22 DIAGNOSIS — R5381 Other malaise: Secondary | ICD-10-CM | POA: Diagnosis not present

## 2024-03-22 DIAGNOSIS — I351 Nonrheumatic aortic (valve) insufficiency: Secondary | ICD-10-CM

## 2024-03-22 DIAGNOSIS — I361 Nonrheumatic tricuspid (valve) insufficiency: Secondary | ICD-10-CM | POA: Diagnosis not present

## 2024-03-22 NOTE — Progress Notes (Signed)
 Location:  Other Nursing Home Room Number: [facility] [address]A Place of Service:  SNF [phone]) Provider:  Alver Jobs, Lisa Rideau, MD  Patient Care Team: Valrie Gehrig, MD as PCP - General (Family Medicine) Verma Gobble, NP as Nurse Practitioner (Geriatric Medicine)  Extended Emergency Contact Information Primary Emergency Contact: [facility] Address: [address]          [place], [place] [address] [place] Home Phone: [phone] Mobile Phone: [phone] Relation: Son Secondary Emergency Contact: Piechocki,[name] Address: [address]          [place], [place] [address] [place] Work Phone: [phone] Mobile Phone: [phone] Relation: Son  Code Status:  DNR Goals of care: Advanced Directive information    02/04/2024   10:22 AM  Advanced Directives  Does Patient Have a Medical Advance Directive? No  Would patient like information on creating a medical advance directive? No - Patient declined     Chief Complaint  Patient presents with   Medical Management of Chronic Issues    HPI:  Pt is a 89 y.o. female seen today for an routine visit.  Discussed the use of AI scribe software for clinical note transcription with the patient, who gave verbal consent to proceed.  History of Present Illness    History of Present Illness The patient presents with weakness and decreased stamina.  The patient describes a general sense of weakness and decreased stamina, feeling 'a little bit weaker' and having 'lost some of her vigor.' She attends the exercise room every morning and is under the care of a physical therapist, yet she feels her stamina has decreased. She is concerned about safety and the risk of falling due to fatigue from activities.  She has been on long-term warfarin therapy. She needs to strap her hand to a walker and use a wheelchair for longer trips, which she finds helpful. She recently went on a trip to a farm using a wheelchair  and enjoyed the experience.  She experienced an episode of difficulty breathing during a recent event, although her vital signs were normal. The episode occurred after attending a lunch with a lot of noise and commotion, which made her feel unhappy and possibly contributed to digestive discomfort. No current breathing difficulties, no swelling in the legs, and no recent procedures on the nose.  She wears compression hose to manage leg swelling, which is not currently an issue. She is socially active, participating in outings and expressing enjoyment in these activities, such as a recent trip to a farm.   Social History - Attends therapy - Uses a wheelchair for longer trips - Lives in a private home   Past Medical History:  Diagnosis Date   Anemia    Per [facility] Records   Carpal tunnel syndrome    Per [facility] Records   Difficulty walking    Per [facility] Records   Gait disturbance    Hypertension    Low serum vitamin D    Stage 3b chronic kidney disease (HCC)    Tinnitus    Per [facility] Records   Past Surgical History:  Procedure Laterality Date   APPENDECTOMY     BACK SURGERY     CATARACT EXTRACTION, BILATERAL     CORNEAL TRANSPLANT     TOTAL ABDOMINAL HYSTERECTOMY W/ BILATERAL SALPINGOOPHORECTOMY     TUBAL LIGATION      No Known Allergies  Outpatient Encounter Medications as of 03/22/2024  Medication Sig  amLODipine  (NORVASC ) 5 MG tablet Take 5 mg by mouth daily.   calcium carbonate (OSCAL) 1500 (600 Ca) MG TABS tablet Take 600 mg of elemental calcium by mouth 2 (two) times daily with a meal.   Cholecalciferol (VITAMIN D3) 50 MCG (2000 UT) TABS Take 1 tablet by mouth daily.   furosemide (LASIX) 40 MG tablet Take 40 mg by mouth daily.   lisinopril  (ZESTRIL ) 40 MG tablet Take 40 mg by mouth daily.   melatonin 5 MG TABS Take 5 mg by mouth at bedtime.   metoprolol succinate (TOPROL-XL) 25 MG 24 hr tablet Take 1 tablet by mouth in the morning and at bedtime.    polyethylene glycol (MIRALAX / GLYCOLAX) 17 g packet Take 17 g by mouth daily.   Sunscreen SPF50 LOTN Apply 1 Application topically every 2 (two) hours as needed (Sunburn Prevention).   No facility-administered encounter medications on file as of 03/22/2024.    Review of Systems  Immunization History  Administered Date(s) Administered   Fluad Quad(high Dose 65+) 09/17/2023   Influenza Split 10/18/2015   Influenza, High Dose Seasonal PF 09/10/2022   Influenza-Unspecified 09/24/2016   Moderna Covid-19 Fall Seasonal Vaccine 78yrs & older 03/04/2023   Moderna Covid-19 Vaccine Bivalent Booster 64yrs & up 04/12/2021, 08/17/2021   Moderna SARS-COV2 Booster Vaccination 10/10/2020   Moderna Sars-Covid-2 Vaccination 11/30/2019, 01/07/2020   PNEUMOCOCCAL CONJUGATE-20 09/06/2022   Pneumococcal Conjugate-13 10/30/2016, 12/24/2018   Pneumococcal Polysaccharide-23 11/29/2005   Pneumococcal-Unspecified 11/25/2005   Tdap 10/29/2011, 07/21/2022   Unspecified SARS-COV-2 Vaccination 08/22/2023   Zoster Recombinant(Shingrix) 02/25/2018, 06/15/2018   Zoster, Live 02/23/2006   Pertinent  Health Maintenance Due  Topic Date Due   INFLUENZA VACCINE  06/25/2024   DEXA SCAN  Completed      04/16/2021   12:12 PM 07/21/2022    6:58 PM 07/22/2022    1:12 AM 06/05/2023    2:44 PM 10/20/2023    9:27 PM  Fall Risk  Falls in the past year?    0 1  Was there an injury with Fall?     0  Fall Risk Category Calculator     2  (RETIRED) Patient Fall Risk Level High fall risk Low fall risk Low fall risk    Patient at Risk for Falls Due to     History of fall(s)  Fall risk Follow up     Falls evaluation completed   Functional Status Survey:    Vitals:   03/22/24 2241  BP: 139/81  Pulse: 72  Resp: 18  Temp: (!) 97.1 F (36.2 C)  SpO2: 92%  Weight: 130 lb 6.4 oz (59.1 kg)   Body mass index is 23.1 kg/m. Physical Exam Gen: well-appearing CHEST: Lungs clear to auscultation bilaterally. EXTREMITIES: No  edema in lower extremities.  Labs reviewed: Recent Labs    08/25/23 0000 10/30/23 0000 11/27/23 0000 01/26/24 0000  NA 136* 135* 138 137  K 3.9 4.1 4.0 3.8  CL 97* 94* 98* 99  CO2 32* 33* 9* 33*  BUN 28* 30* 31* 30*  CREATININE 0.9 1.1 1.1 1.1  CALCIUM 9.0 9.7  --  8.9   Recent Labs    08/07/23 0000  AST 18  ALT 9  ALKPHOS 72  ALBUMIN 3.8   Recent Labs    07/31/23 0000 10/30/23 0000 01/26/24 0000  WBC 4.7 5.3 4.9  NEUTROABS 2,853.00 3,106.00 2,426.00  HGB 11.3* 12.1 11.2*  HCT 34* 36 33*  PLT 273 284 236   No results found  for: "TSH" No results found for: "HGBA1C" No results found for: "CHOL", "HDL", "LDLCALC", "LDLDIRECT", "TRIG", "CHOLHDL"  Significant Diagnostic Results in last 30 days:  No results found.  Assessment/Plan Decreased stamina and weakness Reports decreased stamina and vigor, likely age-related. Attends the exercise room daily, but stamina has declined. Emphasized maintaining activity levels to prevent further decline and the risk of falls due to fatigue. - Encourage continued use of the exercise room. - Consider therapy to improve stamina if needed.  Use of wheelchair for mobility Uses a wheelchair for longer trips to manage decreased stamina and weakness, ensuring safety and preventing fatigue-related falls. - Continue using a wheelchair for longer trips to ensure safety and prevent fatigue-related falls.  Valvular disease Discussed concern she is having worsening cardiac function per most recent echocardiogram. Appears euvolemic at this time.  - continue lasix.   Family/ staff Communication: nursing  Labs/tests ordered:  none

## 2024-04-12 ENCOUNTER — Non-Acute Institutional Stay (SKILLED_NURSING_FACILITY): Payer: Self-pay | Admitting: Student

## 2024-04-12 ENCOUNTER — Encounter: Payer: Self-pay | Admitting: Student

## 2024-04-12 DIAGNOSIS — I5032 Chronic diastolic (congestive) heart failure: Secondary | ICD-10-CM | POA: Diagnosis not present

## 2024-04-12 DIAGNOSIS — R32 Unspecified urinary incontinence: Secondary | ICD-10-CM | POA: Diagnosis not present

## 2024-04-12 NOTE — Progress Notes (Signed)
 Location:   [facility] Room Number: 205-A Place of Service:  SNF [phone]) Provider:  Dr.Barbarajean Kinzler   PCP: Valrie Gehrig, MD  Patient Care Team: Valrie Gehrig, MD as PCP - General (Family Medicine) Verma Gobble, NP as Nurse Practitioner (Geriatric Medicine)  Extended Emergency Contact Information Primary Emergency Contact: [facility] Address: [address]          [place], [place] [address] [place] Home Phone: [phone] Mobile Phone: [phone] Relation: Son Secondary Emergency Contact: Bugarin,[name] Address: [address]          [place], [place] [address] [place] Work Phone: [phone] Mobile Phone: [phone] Relation: Son  Code Status:  DNR Goals of care: Advanced Directive information    04/12/2024    1:45 PM  Advanced Directives  Does Patient Have a Medical Advance Directive? Yes  Type of Advance Directive Out of facility DNR (pink MOST or yellow form)  Does patient want to make changes to medical advance directive? No - Patient declined     Chief Complaint  Patient presents with   Urinary Incontinence    HPI:  Pt is a 89 y.o. female seen today for an acute visit for urinary incontinence  History of Present Illness The patient is a 89 year old who presents with urinary incontinence and general malaise.  Yesterday, she experienced a significant episode of urinary incontinence, characterized by an inability to control her bladder, leading to frequent urination throughout the day. She was unable to reach the bathroom in time and had to use pads. This morning, the symptoms have not been as severe.  She feels unwell today, noting that she usually participates in morning exercise but felt too faint to do so. She describes a general sense of not feeling like her normal self. Despite this, she has regular bowel movements and feels better after eating, although she still does not feel completely well.  No  issues with breathing. She reports feeling faint and not like her usual self, which prevented her from exercising.   Past Medical History:  Diagnosis Date   Anemia    Per [facility] Records   Carpal tunnel syndrome    Per [facility] Records   Difficulty walking    Per [facility] Records   Gait disturbance    Hypertension    Low serum vitamin D    Stage 3b chronic kidney disease (HCC)    Tinnitus    Per [facility] Records   Past Surgical History:  Procedure Laterality Date   APPENDECTOMY     BACK SURGERY     CATARACT EXTRACTION, BILATERAL     CORNEAL TRANSPLANT     TOTAL ABDOMINAL HYSTERECTOMY W/ BILATERAL SALPINGOOPHORECTOMY     TUBAL LIGATION      No Known Allergies  Allergies as of 04/12/2024   No Known Allergies      Medication List        Accurate as of Apr 12, 2024  1:45 PM. If you have any questions, ask your nurse or doctor.          amLODipine  5 MG tablet Commonly known as: NORVASC  Take 5 mg by mouth daily.   calcium carbonate 1500 (600 Ca) MG Tabs tablet Commonly known as: OSCAL Take 600 mg of elemental calcium by mouth 2 (two) times daily with a meal.   furosemide 40 MG tablet Commonly known as: LASIX Take 40 mg by mouth daily.   lisinopril  40  MG tablet Commonly known as: ZESTRIL  Take 40 mg by mouth daily.   melatonin 5 MG Tabs Take 5 mg by mouth at bedtime.   metoprolol succinate 25 MG 24 hr tablet Commonly known as: TOPROL-XL Take 1 tablet by mouth in the morning and at bedtime.   polyethylene glycol 17 g packet Commonly known as: MIRALAX / GLYCOLAX Take 17 g by mouth daily.   Sunscreen SPF50 Lotn Apply 1 Application topically every 2 (two) hours as needed (Sunburn Prevention).   Vitamin D3 50 MCG (2000 UT) Tabs Take 1 tablet by mouth daily.        Review of Systems  Immunization History  Administered Date(s) Administered   Fluad Quad(high Dose 65+) 09/17/2023   Influenza Split 10/18/2015   Influenza, High Dose  Seasonal PF 09/10/2022   Influenza-Unspecified 09/24/2016   Moderna Covid-19 Fall Seasonal Vaccine 58yrs & older 03/04/2023   Moderna Covid-19 Vaccine Bivalent Booster 49yrs & up 04/12/2021, 08/17/2021   Moderna SARS-COV2 Booster Vaccination 10/10/2020   Moderna Sars-Covid-2 Vaccination 11/30/2019, 01/07/2020   PNEUMOCOCCAL CONJUGATE-20 09/06/2022   Pneumococcal Conjugate-13 10/30/2016, 12/24/2018   Pneumococcal Polysaccharide-23 11/29/2005   Pneumococcal-Unspecified 11/25/2005   Tdap 10/29/2011, 07/21/2022   Unspecified SARS-COV-2 Vaccination 08/22/2023   Zoster Recombinant(Shingrix) 02/25/2018, 06/15/2018   Zoster, Live 02/23/2006   Pertinent  Health Maintenance Due  Topic Date Due   INFLUENZA VACCINE  06/25/2024   DEXA SCAN  Completed      04/16/2021   12:12 PM 07/21/2022    6:58 PM 07/22/2022    1:12 AM 06/05/2023    2:44 PM 10/20/2023    9:27 PM  Fall Risk  Falls in the past year?    0 1  Was there an injury with Fall?     0  Fall Risk Category Calculator     2  (RETIRED) Patient Fall Risk Level High fall risk Low fall risk Low fall risk    Patient at Risk for Falls Due to     History of fall(s)  Fall risk Follow up     Falls evaluation completed   Functional Status Survey:    Vitals:   04/12/24 1342  BP: 118/64  Pulse: 68  Resp: 18  Temp: 97.7 F (36.5 C)  SpO2: 94%  Weight: 129 lb 6.4 oz (58.7 kg)  Height: 5\' 3"  (1.6 m)   Body mass index is 22.92 kg/m. Physical Exam Constitutional:      Appearance: Normal appearance.  Cardiovascular:     Rate and Rhythm: Normal rate and regular rhythm.     Pulses: Normal pulses.     Heart sounds: Normal heart sounds.  Pulmonary:     Effort: Pulmonary effort is normal.  Abdominal:     General: Abdomen is flat. Bowel sounds are normal.     Palpations: Abdomen is soft.  Musculoskeletal:        General: No swelling or tenderness.  Skin:    General: Skin is warm and dry.  Neurological:     Mental Status: She is alert  and oriented to person, place, and time.     Gait: Gait normal.  Psychiatric:        Mood and Affect: Mood normal.     Labs reviewed: Recent Labs    10/30/23 0000 11/27/23 0000 01/26/24 0000 03/18/24 0000  NA 135* 138 137 137  K 4.1 4.0 3.8 3.8  CL 94* 98* 99 98*  CO2 33* 9* 33* 32*  BUN 30* 31* 30* 27*  CREATININE 1.1 1.1 1.1 1.4*  CALCIUM 9.7  --  8.9 8.8   Recent Labs    08/07/23 0000  AST 18  ALT 9  ALKPHOS 72  ALBUMIN 3.8   Recent Labs    07/31/23 0000 10/30/23 0000 01/26/24 0000  WBC 4.7 5.3 4.9  NEUTROABS 2,853.00 3,106.00 2,426.00  HGB 11.3* 12.1 11.2*  HCT 34* 36 33*  PLT 273 284 236   No results found for: "TSH" No results found for: "HGBA1C" No results found for: "CHOL", "HDL", "LDLCALC", "LDLDIRECT", "TRIG", "CHOLHDL"  Significant Diagnostic Results in last 30 days:  No results found.  Assessment/Plan Urinary incontinence Acute episode characterized by inability to control urination and frequent urination throughout the day, severe enough to require pads. Symptoms were isolated to one day and have not persisted. Differential diagnosis includes overflow incontinence possibly related to constipation, as bowel movements are regular. Urinary tract infection is considered, and urine analysis is planned to rule out infection. Bladder emptying issues are also considered, with potential need for a Foley catheter if significant urinary retention is found. Informed consent obtained regarding Foley catheter placement, including risks and benefits. Some concern this is functional incontinence given continued slowed gait.  - Perform urinalysis to rule out infection. - Conduct bladder scan to assess bladder emptying. - Order blood tests if not done earlier this month. - Consider Foley catheter placement if bladder scan shows significant urinary retention.  HFPEF Appears euvolemic on exam  Family/ staff Communication: nursing  Labs/tests ordered:  none

## 2024-04-15 LAB — BASIC METABOLIC PANEL WITH GFR
BUN: 28 — AB (ref 4–21)
CO2: 34 — AB (ref 13–22)
Chloride: 99 (ref 99–108)
Creatinine: 1.1 (ref 0.5–1.1)
Glucose: 70
Potassium: 3.7 meq/L (ref 3.5–5.1)
Sodium: 140 (ref 137–147)

## 2024-04-15 LAB — CBC AND DIFFERENTIAL
HCT: 32 — AB (ref 36–46)
Hemoglobin: 10.8 — AB (ref 12.0–16.0)
Neutrophils Absolute: 3780
Platelets: 230 10*3/uL (ref 150–400)
WBC: 6.3

## 2024-04-15 LAB — COMPREHENSIVE METABOLIC PANEL WITH GFR: Calcium: 8.9 (ref 8.7–10.7)

## 2024-04-15 LAB — CBC: RBC: 3.2 — AB (ref 3.87–5.11)

## 2024-04-26 LAB — CBC: RBC: 3.17 — AB (ref 3.87–5.11)

## 2024-04-26 LAB — BASIC METABOLIC PANEL WITH GFR
BUN: 27 — AB (ref 4–21)
CO2: 32 — AB (ref 13–22)
Chloride: 101 (ref 99–108)
Creatinine: 1 (ref 0.5–1.1)
Glucose: 74
Potassium: 3.7 meq/L (ref 3.5–5.1)
Sodium: 139 (ref 137–147)

## 2024-04-26 LAB — CBC AND DIFFERENTIAL
HCT: 33 — AB (ref 36–46)
Hemoglobin: 10.7 — AB (ref 12.0–16.0)
Neutrophils Absolute: 3878
Platelets: 233 10*3/uL (ref 150–400)
WBC: 6.4

## 2024-04-26 LAB — COMPREHENSIVE METABOLIC PANEL WITH GFR: Calcium: 8.8 (ref 8.7–10.7)

## 2024-04-28 ENCOUNTER — Encounter: Payer: Self-pay | Admitting: Student

## 2024-04-28 ENCOUNTER — Non-Acute Institutional Stay (SKILLED_NURSING_FACILITY): Payer: Self-pay | Admitting: Student

## 2024-04-28 DIAGNOSIS — R32 Unspecified urinary incontinence: Secondary | ICD-10-CM | POA: Diagnosis not present

## 2024-04-28 DIAGNOSIS — S81812A Laceration without foreign body, left lower leg, initial encounter: Secondary | ICD-10-CM | POA: Diagnosis not present

## 2024-04-28 NOTE — Progress Notes (Signed)
 Location:  Other [facility].  Nursing Home Room Number: [facility] 205A Place of Service:  SNF [phone]) Provider:  Valrie Gehrig, MD  Patient Care Team: Valrie Gehrig, MD as PCP - General (Family Medicine) Verma Gobble, NP as Nurse Practitioner (Geriatric Medicine)  Extended Emergency Contact Information Primary Emergency Contact: [facility] Address: [address]          [place], [place] [address] [place] Home Phone: [phone] Mobile Phone: [phone] Relation: Son Secondary Emergency Contact: Figeroa,[name] Address: [address]          [place], [place] [address] [place] Work Phone: [phone] Mobile Phone: [phone] Relation: Son  Code Status:  DNR Goals of care: Advanced Directive information    04/12/2024    1:45 PM  Advanced Directives  Does Patient Have a Medical Advance Directive? Yes  Type of Advance Directive Out of facility DNR (pink MOST or yellow form)  Does patient want to make changes to medical advance directive? No - Patient declined     Chief Complaint  Patient presents with   Skin Tear    Skin Tear.     HPI:  Pt is a 89 y.o. female seen today for an acute visit for Skin Tear. Patient had a skin tear she would like evaluated. At this time she states dysuria has resolved. She is worried the skin is infected. Reassured there is no redness or drainage surrounding the wound and appears bruised at this time.    Past Medical History:  Diagnosis Date   Anemia    Per [facility] Records   Carpal tunnel syndrome    Per [facility] Records   Difficulty walking    Per [facility] Records   Gait disturbance    Hypertension    Low serum vitamin D    Stage 3b chronic kidney disease (HCC)    Tinnitus    Per [facility] Records   Past Surgical History:  Procedure Laterality Date   APPENDECTOMY     BACK SURGERY     CATARACT EXTRACTION, BILATERAL     CORNEAL TRANSPLANT     TOTAL ABDOMINAL HYSTERECTOMY W/  BILATERAL SALPINGOOPHORECTOMY     TUBAL LIGATION      No Known Allergies  Outpatient Encounter Medications as of 04/28/2024  Medication Sig   amLODipine  (NORVASC ) 5 MG tablet Take 5 mg by mouth daily.   calcium carbonate (OSCAL) 1500 (600 Ca) MG TABS tablet Take 600 mg of elemental calcium by mouth 2 (two) times daily with a meal.   Cholecalciferol (VITAMIN D3) 50 MCG (2000 UT) TABS Take 1 tablet by mouth daily.   ferrous sulfate 325 (65 FE) MG EC tablet Take 325 mg by mouth. Once daily every Monday,Wednesday and Friday   furosemide (LASIX) 40 MG tablet Take 40 mg by mouth daily.   lisinopril  (ZESTRIL ) 40 MG tablet Take 40 mg by mouth daily.   melatonin 5 MG TABS Take 5 mg by mouth at bedtime.   metoprolol succinate (TOPROL-XL) 25 MG 24 hr tablet Take 1 tablet by mouth in the morning and at bedtime.   polyethylene glycol (MIRALAX / GLYCOLAX) 17 g packet Take 17 g by mouth daily.   Sunscreen SPF50 LOTN Apply 1 Application topically every 2 (two) hours as needed (Sunburn Prevention).   No facility-administered encounter medications on file as of 04/28/2024.    Review of Systems  Immunization History  Administered Date(s) Administered   Fluad Quad(high Dose 65+) 09/17/2023  Influenza Split 10/18/2015   Influenza, High Dose Seasonal PF 09/10/2022   Influenza-Unspecified 09/24/2016   Moderna Covid-19 Fall Seasonal Vaccine 87yrs & older 03/04/2023   Moderna Covid-19 Vaccine Bivalent Booster 50yrs & up 04/12/2021, 08/17/2021   Moderna SARS-COV2 Booster Vaccination 10/10/2020   Moderna Sars-Covid-2 Vaccination 11/30/2019, 01/07/2020   PNEUMOCOCCAL CONJUGATE-20 09/06/2022   Pneumococcal Conjugate-13 10/30/2016, 12/24/2018   Pneumococcal Polysaccharide-23 11/29/2005   Pneumococcal-Unspecified 11/25/2005   Tdap 10/29/2011, 07/21/2022   Unspecified SARS-COV-2 Vaccination 08/22/2023   Zoster Recombinant(Shingrix) 02/25/2018, 06/15/2018   Zoster, Live 02/23/2006   Pertinent  Health  Maintenance Due  Topic Date Due   INFLUENZA VACCINE  06/25/2024   DEXA SCAN  Completed      04/16/2021   12:12 PM 07/21/2022    6:58 PM 07/22/2022    1:12 AM 06/05/2023    2:44 PM 10/20/2023    9:27 PM  Fall Risk  Falls in the past year?    0 1  Was there an injury with Fall?     0  Fall Risk Category Calculator     2  (RETIRED) Patient Fall Risk Level High fall risk Low fall risk Low fall risk    Patient at Risk for Falls Due to     History of fall(s)  Fall risk Follow up     Falls evaluation completed   Functional Status Survey:    Vitals:   04/28/24 1120  BP: 118/73  Pulse: (!) 59  Resp: 18  Temp: 97.6 F (36.4 C)  SpO2: 92%  Weight: 130 lb (59 kg)  Height: 5' 3 (1.6 m)   Body mass index is 23.03 kg/m. Physical Exam Constitutional:      Appearance: Normal appearance.   Musculoskeletal:     Comments: 1.5 cm skin tear with surrounding bruise   Neurological:     Mental Status: She is alert.     Labs reviewed: Recent Labs    03/18/24 0000 04/15/24 0000 04/26/24 0000  NA 137 140 139  K 3.8 3.7 3.7  CL 98* 99 101  CO2 32* 34* 32*  BUN 27* 28* 27*  CREATININE 1.4* 1.1 1.0  CALCIUM 8.8 8.9 8.8   Recent Labs    08/07/23 0000  AST 18  ALT 9  ALKPHOS 72  ALBUMIN 3.8   Recent Labs    01/26/24 0000 04/15/24 0000 04/26/24 0000  WBC 4.9 6.3 6.4  NEUTROABS 2,426.00 3,780.00 3,878.00  HGB 11.2* 10.8* 10.7*  HCT 33* 32* 33*  PLT 236 230 233   No results found for: TSH No results found for: HGBA1C No results found for: CHOL, HDL, LDLCALC, LDLDIRECT, TRIG, CHOLHDL  Significant Diagnostic Results in last 30 days:  No results found.  Assessment/Plan Noninfected skin tear of left lower extremity, initial encounter  Urinary incontinence, unspecified type Skin tear well- approximated without signs of infection at this time. Leave bandage in place until healed. Previously had dysuria and frequency leading to incontinence, she states it  is improved at this time.   Family/ staff Communication: nursing  Labs/tests ordered:  none

## 2024-05-06 ENCOUNTER — Encounter: Payer: Self-pay | Admitting: Student

## 2024-05-18 ENCOUNTER — Non-Acute Institutional Stay (SKILLED_NURSING_FACILITY): Payer: Self-pay | Admitting: Nurse Practitioner

## 2024-05-18 ENCOUNTER — Encounter: Payer: Self-pay | Admitting: Nurse Practitioner

## 2024-05-18 DIAGNOSIS — I5032 Chronic diastolic (congestive) heart failure: Secondary | ICD-10-CM

## 2024-05-18 DIAGNOSIS — I10 Essential (primary) hypertension: Secondary | ICD-10-CM | POA: Diagnosis not present

## 2024-05-18 DIAGNOSIS — M810 Age-related osteoporosis without current pathological fracture: Secondary | ICD-10-CM | POA: Insufficient documentation

## 2024-05-18 DIAGNOSIS — N3281 Overactive bladder: Secondary | ICD-10-CM | POA: Insufficient documentation

## 2024-05-18 DIAGNOSIS — I351 Nonrheumatic aortic (valve) insufficiency: Secondary | ICD-10-CM | POA: Diagnosis not present

## 2024-05-18 DIAGNOSIS — D509 Iron deficiency anemia, unspecified: Secondary | ICD-10-CM

## 2024-05-18 DIAGNOSIS — K5904 Chronic idiopathic constipation: Secondary | ICD-10-CM | POA: Insufficient documentation

## 2024-05-18 LAB — CBC AND DIFFERENTIAL
HCT: 35 — AB (ref 36–46)
Hemoglobin: 11.4 — AB (ref 12.0–16.0)
Platelets: 254 10*3/uL (ref 150–400)
WBC: 5.6

## 2024-05-18 LAB — IRON,TIBC AND FERRITIN PANEL: Iron: 53

## 2024-05-18 LAB — CBC: RBC: 3.45 — AB (ref 3.87–5.11)

## 2024-05-18 NOTE — Assessment & Plan Note (Signed)
 Ongoing symptoms, will add myrbetriq 25 mg by mouth daily  Staff to monitor bp daily for 2 weeks after start

## 2024-05-18 NOTE — Assessment & Plan Note (Signed)
Controlled on miralax.

## 2024-05-18 NOTE — Assessment & Plan Note (Signed)
 Stable, no symptoms at this time.

## 2024-05-18 NOTE — Assessment & Plan Note (Signed)
 Blood pressure well controlled, goal bp <140/90 Continue current medications and dietary modifications follow metabolic panel

## 2024-05-18 NOTE — Assessment & Plan Note (Signed)
 Continues on cal and vit d supplement

## 2024-05-18 NOTE — Progress Notes (Signed)
 Location:  Other Nursing Home Room Number: [facility] 205 A Place of Service:  SNF [phone])  Abdul, [place], MD  Patient Care Team: Abdul Fine, MD as PCP - General (Family Medicine) Caro Harlene POUR, NP as Nurse Practitioner (Geriatric Medicine)  Extended Emergency Contact Information Primary Emergency Contact: [facility] Address: [address]          [place], [place] [address] [place] Home Phone: [phone] Mobile Phone: [phone] Relation: Son Secondary Emergency Contact: Chisom,[name] Address: [address]          [place], [place] [address] [place] Work Phone: [phone] Mobile Phone: [phone] Relation: Son  Goals of care: Advanced Directive information    05/18/2024   11:54 AM  Advanced Directives  Does Patient Have a Medical Advance Directive? Yes  Type of Advance Directive Out of facility DNR (pink MOST or yellow form)  Does patient want to make changes to medical advance directive? No - Patient declined     Chief Complaint  Patient presents with   Medical Management of Chronic Issues    Routine Visit    HPI:  Pt is a 89 y.o. female seen today for medical management of chronic disease.   She complains of frequency and urgency of urination continuously- UA C&S with low bacteria and has been treated twice.   She does have urinary incontinence and frequency which is the biggest complaint. No dysuria.   Noted to have anemia- currently on iron supplements  Reports she eats well Denies any constipation   She continues to get around facility with walker No complaints of pain.   No chest pain, shortness of breath or LE edema.     Past Medical History:  Diagnosis Date   Anemia    Per [facility] Records   Carpal tunnel syndrome    Per [facility] Records   Difficulty walking    Per [facility] Records   Gait disturbance    Hypertension    Low serum vitamin D    Stage 3b chronic kidney disease (HCC)     Tinnitus    Per [facility] Records   Past Surgical History:  Procedure Laterality Date   APPENDECTOMY     BACK SURGERY     CATARACT EXTRACTION, BILATERAL     CORNEAL TRANSPLANT     TOTAL ABDOMINAL HYSTERECTOMY W/ BILATERAL SALPINGOOPHORECTOMY     TUBAL LIGATION      No Known Allergies  Outpatient Encounter Medications as of 05/18/2024  Medication Sig   amLODipine  (NORVASC ) 5 MG tablet Take 5 mg by mouth daily.   calcium carbonate (OSCAL) 1500 (600 Ca) MG TABS tablet Take 600 mg of elemental calcium by mouth 2 (two) times daily with a meal.   Cholecalciferol (VITAMIN D3) 50 MCG (2000 UT) TABS Take 1 tablet by mouth daily.   ferrous sulfate 325 (65 FE) MG EC tablet Take 325 mg by mouth. Once daily every Monday,Wednesday and Friday   furosemide (LASIX) 40 MG tablet Take 40 mg by mouth daily.   lisinopril  (ZESTRIL ) 40 MG tablet Take 40 mg by mouth daily.   melatonin 5 MG TABS Take 5 mg by mouth at bedtime.   metoprolol succinate (TOPROL-XL) 25 MG 24 hr tablet Take 1 tablet by mouth in the morning and at bedtime.   polyethylene glycol (MIRALAX / GLYCOLAX) 17 g packet Take 17 g by mouth daily.   Sunscreen SPF50 LOTN Apply 1 Application topically every 2 (two) hours as needed (  Sunburn Prevention).   No facility-administered encounter medications on file as of 05/18/2024.    Review of Systems  Constitutional:  Negative for activity change, appetite change, fatigue and unexpected weight change.  HENT:  Negative for congestion and hearing loss.   Eyes: Negative.   Respiratory:  Negative for cough and shortness of breath.   Cardiovascular:  Negative for chest pain, palpitations and leg swelling.  Gastrointestinal:  Negative for abdominal pain, constipation and diarrhea.  Genitourinary:  Positive for frequency. Negative for difficulty urinating, dysuria and vaginal pain.  Musculoskeletal:  Negative for arthralgias and myalgias.  Skin:  Negative for color change and wound.  Neurological:   Negative for dizziness and weakness.  Psychiatric/Behavioral:  Negative for agitation, behavioral problems and confusion.      Immunization History  Administered Date(s) Administered   Fluad Quad(high Dose 65+) 09/17/2023   Influenza Split 10/18/2015   Influenza, High Dose Seasonal PF 09/10/2022   Influenza-Unspecified 09/24/2016   Moderna Covid-19 Fall Seasonal Vaccine 88yrs & older 03/04/2023   Moderna Covid-19 Vaccine Bivalent Booster 30yrs & up 04/12/2021, 08/17/2021   Moderna SARS-COV2 Booster Vaccination 10/10/2020   Moderna Sars-Covid-2 Vaccination 11/30/2019, 01/07/2020   PNEUMOCOCCAL CONJUGATE-20 09/06/2022   Pneumococcal Conjugate-13 10/30/2016, 12/24/2018   Pneumococcal Polysaccharide-23 11/29/2005   Pneumococcal-Unspecified 11/25/2005   Tdap 10/29/2011, 07/21/2022   Unspecified SARS-COV-2 Vaccination 08/22/2023   Zoster Recombinant(Shingrix) 02/25/2018, 06/15/2018   Zoster, Live 02/23/2006   Pertinent  Health Maintenance Due  Topic Date Due   INFLUENZA VACCINE  06/25/2024   DEXA SCAN  Completed      04/16/2021   12:12 PM 07/21/2022    6:58 PM 07/22/2022    1:12 AM 06/05/2023    2:44 PM 10/20/2023    9:27 PM  Fall Risk  Falls in the past year?    0 1  Was there an injury with Fall?     0  Fall Risk Category Calculator     2  (RETIRED) Patient Fall Risk Level High fall risk  Low fall risk  Low fall risk     Patient at Risk for Falls Due to     History of fall(s)  Fall risk Follow up     Falls evaluation completed     Data saved with a previous flowsheet row definition   Functional Status Survey:    Vitals:   05/18/24 1152  BP: 104/70  Pulse: 72  Resp: 18  Temp: 98.6 F (37 C)  SpO2: 94%  Weight: 130 lb (59 kg)  Height: 5' 3 (1.6 m)   Body mass index is 23.03 kg/m. Physical Exam Constitutional:      General: She is not in acute distress.    Appearance: She is well-developed. She is not diaphoretic.  HENT:     Head: Normocephalic and atraumatic.      Mouth/Throat:     Pharynx: No oropharyngeal exudate.   Eyes:     Conjunctiva/sclera: Conjunctivae normal.     Pupils: Pupils are equal, round, and reactive to light.    Cardiovascular:     Rate and Rhythm: Normal rate and regular rhythm.     Heart sounds: Normal heart sounds.  Pulmonary:     Effort: Pulmonary effort is normal.     Breath sounds: Normal breath sounds.  Abdominal:     General: Bowel sounds are normal.     Palpations: Abdomen is soft.   Musculoskeletal:     Cervical back: Normal range of motion and neck supple.  Right lower leg: No edema.     Left lower leg: No edema.   Skin:    General: Skin is warm and dry.   Neurological:     Mental Status: She is alert.   Psychiatric:        Mood and Affect: Mood normal.     Labs reviewed: Recent Labs    03/18/24 0000 04/15/24 0000 04/26/24 0000  NA 137 140 139  K 3.8 3.7 3.7  CL 98* 99 101  CO2 32* 34* 32*  BUN 27* 28* 27*  CREATININE 1.4* 1.1 1.0  CALCIUM 8.8 8.9 8.8   Recent Labs    08/07/23 0000  AST 18  ALT 9  ALKPHOS 72  ALBUMIN 3.8   Recent Labs    01/26/24 0000 04/15/24 0000 04/26/24 0000  WBC 4.9 6.3 6.4  NEUTROABS 2,426.00 3,780.00 3,878.00  HGB 11.2* 10.8* 10.7*  HCT 33* 32* 33*  PLT 236 230 233   No results found for: TSH No results found for: HGBA1C No results found for: CHOL, HDL, LDLCALC, LDLDIRECT, TRIG, CHOLHDL  Significant Diagnostic Results in last 30 days:  No results found.  Assessment/Plan Age-related osteoporosis without current pathological fracture Continues on cal and vit d supplement   Anemia Ongoing, follow up labs schedule. No signs of bleeding. Continues on iron supplement.   Chronic heart failure with preserved ejection fraction (HCC) Euvolemic, continues on lisinopril , metoprolol and lasix  Chronic idiopathic constipation Controlled on miralax  Essential hypertension Blood pressure well controlled, goal bp <140/90 Continue  current medications and dietary modifications follow metabolic panel  Nonrheumatic aortic valve insufficiency Stable, no symptoms at this time.   OAB (overactive bladder) Ongoing symptoms, will add myrbetriq 25 mg by mouth daily  Staff to monitor bp daily for 2 weeks after start     Lewis Keats K. Caro BODILY [facility] [phone]

## 2024-05-18 NOTE — Assessment & Plan Note (Signed)
 Ongoing, follow up labs schedule. No signs of bleeding. Continues on iron supplement.

## 2024-05-18 NOTE — Assessment & Plan Note (Signed)
 Euvolemic, continues on lisinopril , metoprolol and lasix

## 2024-05-24 ENCOUNTER — Non-Acute Institutional Stay (SKILLED_NURSING_FACILITY): Payer: Self-pay | Admitting: Adult Health

## 2024-05-24 ENCOUNTER — Encounter: Payer: Self-pay | Admitting: Adult Health

## 2024-05-24 DIAGNOSIS — B372 Candidiasis of skin and nail: Secondary | ICD-10-CM

## 2024-05-24 DIAGNOSIS — I10 Essential (primary) hypertension: Secondary | ICD-10-CM

## 2024-05-24 DIAGNOSIS — N3281 Overactive bladder: Secondary | ICD-10-CM | POA: Diagnosis not present

## 2024-05-24 NOTE — Progress Notes (Signed)
 Location:  Other [facility]) Nursing Home Room Number: [facility] 205A Place of Service:  SNF [phone]) Provider:  Medina-Vargas, Shadana Pry, DNP, FNP-BC  Patient Care Team: Abdul Fine, MD as PCP - General (Family Medicine) Caro Harlene POUR, NP as Nurse Practitioner (Geriatric Medicine)  Extended Emergency Contact Information Primary Emergency Contact: [facility] Address: [address]          [place], [place] [address] [place] Home Phone: [phone] Mobile Phone: [phone] Relation: Son Secondary Emergency Contact: Lieurance,[name] Address: [address]          [place], [place] [address] [place] Work Phone: [phone] Mobile Phone: [phone] Relation: Son  Code Status:  DNR  Goals of care: Advanced Directive information    05/24/2024    2:02 PM  Advanced Directives  Does Patient Have a Medical Advance Directive? Yes  Type of Advance Directive Out of facility DNR (pink MOST or yellow form)  Does patient want to make changes to medical advance directive? No - Patient declined     Chief Complaint  Patient presents with   Acute Visit    Rashes under right breast.    HPI:  Pt is a 89 y.o. female seen today for an acute visit regarding rashes under right breast.  She is a resident of [facility]. She has erythematous rashes under right breast. She stated that the rashes are itching and burns.  Essential hypertension  -  BP 124/74, takes Norvasc  and metoprolol succinate  OAB (overactive bladder) -   takes Myrbetriq    Past Medical History:  Diagnosis Date   Anemia    Per [facility] Records   Carpal tunnel syndrome    Per [facility] Records   Difficulty walking    Per [employer] [facility] Records   Gait disturbance    Hypertension    Low serum vitamin D    Stage 3b chronic kidney disease (HCC)    Tinnitus    Per [facility] Records   Past Surgical History:  Procedure Laterality Date   APPENDECTOMY     BACK SURGERY      CATARACT EXTRACTION, BILATERAL     CORNEAL TRANSPLANT     TOTAL ABDOMINAL HYSTERECTOMY W/ BILATERAL SALPINGOOPHORECTOMY     TUBAL LIGATION      No Known Allergies  Outpatient Encounter Medications as of 05/24/2024  Medication Sig   amLODipine  (NORVASC ) 5 MG tablet Take 5 mg by mouth daily.   calcium carbonate (OSCAL) 1500 (600 Ca) MG TABS tablet Take 600 mg of elemental calcium by mouth 2 (two) times daily with a meal.   Cholecalciferol (VITAMIN D3) 50 MCG (2000 UT) TABS Take 1 tablet by mouth daily.   furosemide (LASIX) 40 MG tablet Take 40 mg by mouth daily.   lisinopril  (ZESTRIL ) 40 MG tablet Take 40 mg by mouth daily.   melatonin 5 MG TABS Take 5 mg by mouth at bedtime.   metoprolol succinate (TOPROL-XL) 25 MG 24 hr tablet Take 1 tablet by mouth in the morning and at bedtime.   mirabegron ER (MYRBETRIQ) 25 MG TB24 tablet Take 25 mg by mouth at bedtime.   nystatin powder Apply 1 Application topically 2 (two) times daily.   polyethylene glycol (MIRALAX / GLYCOLAX) 17 g packet Take 17 g by mouth daily.   Sunscreen SPF50 LOTN Apply 1 Application topically every 2 (two) hours as needed (Sunburn Prevention).   ferrous sulfate 325 (65 FE) MG EC tablet Take 325  mg by mouth. Once daily every Monday,Wednesday and Friday (Patient not taking: Reported on 05/24/2024)   No facility-administered encounter medications on file as of 05/24/2024.    Review of Systems  Constitutional:  Negative for appetite change, chills, fatigue and fever.  HENT:  Negative for congestion, hearing loss, rhinorrhea and sore throat.   Eyes: Negative.   Respiratory:  Negative for cough, shortness of breath and wheezing.   Cardiovascular:  Negative for chest pain, palpitations and leg swelling.  Gastrointestinal:  Negative for abdominal pain, constipation, diarrhea, nausea and vomiting.  Genitourinary:  Negative for dysuria.  Musculoskeletal:  Negative for arthralgias, back pain and myalgias.  Skin:  Positive for  rash. Negative for color change and wound.  Neurological:  Negative for dizziness, weakness and headaches.  Psychiatric/Behavioral:  Negative for behavioral problems. The patient is not nervous/anxious.      Immunization History  Administered Date(s) Administered   Fluad Quad(high Dose 65+) 09/17/2023   Influenza Split 10/18/2015   Influenza, High Dose Seasonal PF 09/10/2022   Influenza-Unspecified 09/24/2016   Moderna Covid-19 Fall Seasonal Vaccine 16yrs & older 03/04/2023   Moderna Covid-19 Vaccine Bivalent Booster 81yrs & up 04/12/2021, 08/17/2021   Moderna SARS-COV2 Booster Vaccination 10/10/2020   Moderna Sars-Covid-2 Vaccination 11/30/2019, 01/07/2020   PNEUMOCOCCAL CONJUGATE-20 09/06/2022   Pneumococcal Conjugate-13 10/30/2016, 12/24/2018   Pneumococcal Polysaccharide-23 11/29/2005   Pneumococcal-Unspecified 11/25/2005   Tdap 10/29/2011, 07/21/2022   Unspecified SARS-COV-2 Vaccination 08/22/2023   Zoster Recombinant(Shingrix) 02/25/2018, 06/15/2018   Zoster, Live 02/23/2006   Pertinent  Health Maintenance Due  Topic Date Due   INFLUENZA VACCINE  06/25/2024   DEXA SCAN  Completed      04/16/2021   12:12 PM 07/21/2022    6:58 PM 07/22/2022    1:12 AM 06/05/2023    2:44 PM 10/20/2023    9:27 PM  Fall Risk  Falls in the past year?    0 1  Was there an injury with Fall?     0  Fall Risk Category Calculator     2  (RETIRED) Patient Fall Risk Level High fall risk  Low fall risk  Low fall risk     Patient at Risk for Falls Due to     History of fall(s)  Fall risk Follow up     Falls evaluation completed     Data saved with a previous flowsheet row definition     Vitals:   05/24/24 1400  BP: 124/74  Resp: 18  Temp: 98 F (36.7 C)  SpO2: 95%  Weight: 130 lb 9.6 oz (59.2 kg)  Height: 5' 3 (1.6 m)   Body mass index is 23.13 kg/m.  Physical Exam Constitutional:      Appearance: Normal appearance. She is normal weight.  HENT:     Head: Normocephalic and  atraumatic.     Nose: Nose normal.     Mouth/Throat:     Mouth: Mucous membranes are moist.  Eyes:     Conjunctiva/sclera: Conjunctivae normal.  Cardiovascular:     Rate and Rhythm: Normal rate and regular rhythm.  Pulmonary:     Effort: Pulmonary effort is normal.     Breath sounds: Normal breath sounds.  Abdominal:     General: Bowel sounds are normal.     Palpations: Abdomen is soft.  Musculoskeletal:        General: Normal range of motion.     Cervical back: Normal range of motion.  Skin:    General: Skin is warm  and dry.     Findings: Rash present.     Comments: Erythematous rashes under right breast  Neurological:     Mental Status: She is alert.  Psychiatric:        Mood and Affect: Mood normal.        Behavior: Behavior normal.      Labs reviewed: Recent Labs    03/18/24 0000 04/15/24 0000 04/26/24 0000  NA 137 140 139  K 3.8 3.7 3.7  CL 98* 99 101  CO2 32* 34* 32*  BUN 27* 28* 27*  CREATININE 1.4* 1.1 1.0  CALCIUM 8.8 8.9 8.8   Recent Labs    08/07/23 0000  AST 18  ALT 9  ALKPHOS 72  ALBUMIN 3.8   Recent Labs    01/26/24 0000 04/15/24 0000 04/26/24 0000 05/18/24 0000  WBC 4.9 6.3 6.4 5.6  NEUTROABS 2,426.00 3,780.00 3,878.00  --   HGB 11.2* 10.8* 10.7* 11.4*  HCT 33* 32* 33* 35*  PLT 236 230 233 254   No results found for: TSH No results found for: HGBA1C No results found for: CHOL, HDL, LDLCALC, LDLDIRECT, TRIG, CHOLHDL  Significant Diagnostic Results in last 30 days:  No results found.  Assessment/Plan  1. Candidal skin infection (Primary) -  will start on Nystatin ointment 100,000 units/g apply to rashes under right breast twice a day x 2 weeks -   Keep area clean and dry  2. Essential hypertension -   BP stable -   Continue Norvasc  5 mg daily - Continue metoprolol succinate 25 mg daily   3. OAB (overactive bladder) -   Continue Myrbetriq 25 mg at bedtime    Family/ staff Communication: Discussed plan of  care with resident and charge nurse.  Labs/tests ordered:  None    Monty Mccarrell Medina-Vargas, DNP, MSN, FNP-BC [facility] [phone] (Monday-Friday 8:00 a.m. - 5:00 p.m.) [phone] (after hours)

## 2024-06-15 ENCOUNTER — Encounter: Payer: Self-pay | Admitting: Nurse Practitioner

## 2024-06-15 ENCOUNTER — Non-Acute Institutional Stay (SKILLED_NURSING_FACILITY): Payer: Self-pay | Admitting: Nurse Practitioner

## 2024-06-15 DIAGNOSIS — Z0000 Encounter for general adult medical examination without abnormal findings: Secondary | ICD-10-CM | POA: Diagnosis not present

## 2024-06-15 NOTE — Progress Notes (Signed)
 Subjective:   Brittany Bryant is a 89 y.o. female who presents for Medicare Annual (Subsequent) preventive examination.  Visit Complete: In person [facility]  Cardiac Risk Factors include: sedentary lifestyle;advanced age (>72men, >19 women);hypertension     Objective:    Today's Vitals   06/15/24 1301  BP: 135/79  Pulse: (!) 56  Resp: 20  Temp: (!) 97.5 F (36.4 C)  SpO2: 95%  Weight: 130 lb 11.2 oz (59.3 kg)  Height: 5' 3 (1.6 m)   Body mass index is 23.15 kg/m.     05/24/2024    2:02 PM 05/18/2024   11:54 AM 04/12/2024    1:45 PM 02/04/2024   10:22 AM 11/27/2023    9:27 AM 11/24/2023    3:41 PM 08/28/2023   11:27 AM  Advanced Directives  Does Patient Have a Medical Advance Directive? Yes Yes Yes No No No Yes  Type of Advance Directive Out of facility DNR (pink MOST or yellow form) Out of facility DNR (pink MOST or yellow form) Out of facility DNR (pink MOST or yellow form)      Does patient want to make changes to medical advance directive? No - Patient declined No - Patient declined No - Patient declined    No - Patient declined  Would patient like information on creating a medical advance directive?    No - Patient declined No - Patient declined No - Patient declined     Current Medications (verified) Outpatient Encounter Medications as of 06/15/2024  Medication Sig   amLODipine  (NORVASC ) 5 MG tablet Take 5 mg by mouth daily.   calcium carbonate (OSCAL) 1500 (600 Ca) MG TABS tablet Take 600 mg of elemental calcium by mouth 2 (two) times daily with a meal.   Cholecalciferol (VITAMIN D3) 50 MCG (2000 UT) TABS Take 1 tablet by mouth daily.   furosemide (LASIX) 40 MG tablet Take 40 mg by mouth daily.   lisinopril  (ZESTRIL ) 40 MG tablet Take 40 mg by mouth daily.   melatonin 5 MG TABS Take 5 mg by mouth at bedtime.   metoprolol succinate (TOPROL-XL) 25 MG 24 hr tablet Take 1 tablet by mouth in the morning and at bedtime.   mirabegron ER (MYRBETRIQ) 25 MG TB24 tablet Take  25 mg by mouth at bedtime.   polyethylene glycol (MIRALAX / GLYCOLAX) 17 g packet Take 17 g by mouth daily. (Patient taking differently: Take 17 g by mouth at bedtime. Every Monday,Wednesday and Friday)   Sunscreen SPF50 LOTN Apply 1 Application topically every 2 (two) hours as needed (Sunburn Prevention).   ferrous sulfate 325 (65 FE) MG EC tablet Take 325 mg by mouth. Once daily every Monday,Wednesday and Friday (Patient not taking: Reported on 06/15/2024)   nystatin powder Apply 1 Application topically 2 (two) times daily. (Patient not taking: Reported on 06/15/2024)   No facility-administered encounter medications on file as of 06/15/2024.    Allergies (verified) Patient has no known allergies.   History: Past Medical History:  Diagnosis Date   Anemia    Per [facility] Records   Carpal tunnel syndrome    Per [facility] Records   Difficulty walking    Per [facility] Records   Gait disturbance    Hypertension    Low serum vitamin D    Stage 3b chronic kidney disease (HCC)    Tinnitus    Per [facility] Records   Past Surgical History:  Procedure Laterality Date   APPENDECTOMY  BACK SURGERY     CATARACT EXTRACTION, BILATERAL     CORNEAL TRANSPLANT     TOTAL ABDOMINAL HYSTERECTOMY W/ BILATERAL SALPINGOOPHORECTOMY     TUBAL LIGATION     Family History  Problem Relation Age of Onset   Arthritis Father    COPD Father    Social History   Socioeconomic History   Marital status: Widowed    Spouse name: Not on file   Number of children: Not on file   Years of education: Not on file   Highest education level: Not on file  Occupational History   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  Tobacco Use   Smoking status: Never   Smokeless tobacco: Never  Vaping Use   Vaping status: Never Used  Substance and Sexual Activity   Alcohol use: Yes    Comment: rare wine   Drug use: Not Currently   Sexual activity: Not on file  Other Topics Concern    Not on file  Social History Narrative   Widowed ~2012   5 children      Has living will   Son Alm is health care POA   Would accept resuscitation attempts   No feeding tube if cognitively unaware   Social Drivers of Corporate investment banker Strain: Not on file  Food Insecurity: Not on file  Transportation Needs: Not on file  Physical Activity: Not on file  Stress: Not on file  Social Connections: Not on file    Tobacco Counseling Counseling given: Not Answered   Clinical Intake:  Pre-visit preparation completed: Yes  Pain : No/denies pain     BMI - recorded: 23 Nutritional Status: BMI of 19-24  Normal Nutritional Risks: None Diabetes: No  How often do you need to have someone help you when you read instructions, pamphlets, or other written materials from your doctor or pharmacy?: 1 - Never         Activities of Daily Living    06/15/2024    1:33 PM 06/15/2024   12:41 PM  In your present state of health, do you have any difficulty performing the following activities:  Hearing? 0 0  Vision? 0 0  Difficulty concentrating or making decisions? 0 0  Walking or climbing stairs? 1 1  Dressing or bathing? 1 1  Doing errands, shopping? 1 1  Preparing Food and eating ? N N  Using the Toilet? N N  In the past six months, have you accidently leaked urine? Y Y  Do you have problems with loss of bowel control? Y Y  Managing your Medications? Y Y  Managing your Finances? Y Y  Housekeeping or managing your Housekeeping? CINDERELLA CINDERELLA    Patient Care Team: Abdul Fine, MD as PCP - General (Family Medicine) Caro Harlene POUR, NP as Nurse Practitioner (Geriatric Medicine)  Indicate any recent Medical Services you may have received from other than [facility] providers in the past year (date may be approximate).     Assessment:   This is a routine wellness examination for Brittany Bryant.  Hearing/Vision screen No results found.   Goals Addressed   None    Depression  Screen    06/05/2023    2:44 PM  PHQ 2/9 Scores  PHQ - 2 Score 0    Fall Risk    10/20/2023    9:27 PM 06/05/2023    2:44 PM  Fall Risk   Falls in the past year? 1 0  Number falls  in past yr: 1   Injury with Fall? 0   Risk for fall due to : History of fall(s)   Follow up Falls evaluation completed     MEDICARE RISK AT HOME:    TIMED UP AND GO:  Was the test performed?  No    Cognitive Function:        Immunizations Immunization History  Administered Date(s) Administered   Fluad Quad(high Dose 65+) 09/17/2023   Influenza Split 10/18/2015   Influenza, High Dose Seasonal PF 09/10/2022   Influenza-Unspecified 09/24/2016   Moderna Covid-19 Fall Seasonal Vaccine 62yrs & older 03/04/2023   Moderna Covid-19 Vaccine Bivalent Booster 23yrs & up 04/12/2021, 08/17/2021   Moderna SARS-COV2 Booster Vaccination 10/10/2020   Moderna Sars-Covid-2 Vaccination 11/30/2019, 01/07/2020   PNEUMOCOCCAL CONJUGATE-20 09/06/2022   Pneumococcal Conjugate-13 10/30/2016, 12/24/2018   Pneumococcal Polysaccharide-23 11/29/2005   Pneumococcal-Unspecified 11/25/2005   Tdap 10/29/2011, 07/21/2022   Unspecified SARS-COV-2 Vaccination 08/22/2023   Zoster Recombinant(Shingrix) 02/25/2018, 06/15/2018   Zoster, Live 02/23/2006    TDAP status: Up to date  Flu Vaccine status: Up to date  Pneumococcal vaccine status: Up to date  Covid-19 vaccine status: Information provided on how to obtain vaccines.   Qualifies for Shingles Vaccine? Yes   Zostavax completed No   Shingrix Completed?: Yes  Screening Tests Health Maintenance  Topic Date Due   COVID-19 Vaccine (8 - 2024-25 season) 02/19/2024   INFLUENZA VACCINE  06/25/2024   Medicare Annual Wellness (AWV)  06/15/2025   DTaP/Tdap/Td (3 - Td or Tdap) 07/21/2032   Pneumococcal Vaccine: 50+ Years  Completed   DEXA SCAN  Completed   Zoster Vaccines- Shingrix  Completed   Hepatitis B Vaccines  Aged Out   HPV VACCINES  Aged Out   Meningococcal  B Vaccine  Aged Out    Health Maintenance  Health Maintenance Due  Topic Date Due   COVID-19 Vaccine (8 - 2024-25 season) 02/19/2024    Colorectal cancer screening: No longer required.   Mammogram status: No longer required due to age.    Lung Cancer Screening: (Low Dose CT Chest recommended if Age 16-80 years, 20 pack-year currently smoking OR have quit w/in 15years.) does not qualify.   Lung Cancer Screening Referral: na  Additional Screening:  Hepatitis C Screening: does not qualify  Vision Screening: Recommended annual ophthalmology exams for early detection of glaucoma and other disorders of the eye. Is the patient up to date with their annual eye exam?  Yes  Who is the provider or what is the name of the office in which the patient attends annual eye exams? [facility] If pt is not established with a provider, would they like to be referred to a provider to establish care? No .   Dental Screening: Recommended annual dental exams for proper oral hygiene   Community Resource Referral / Chronic Care Management: CRR required this visit?  No   CCM required this visit?  No     Plan:     I have personally reviewed and noted the following in the patient's chart:   Medical and social history Use of alcohol, tobacco or illicit drugs  Current medications and supplements including opioid prescriptions. Patient is not currently taking opioid prescriptions. Functional ability and status Nutritional status Physical activity Advanced directives List of other physicians Hospitalizations, surgeries, and ER visits in previous 12 months Vitals Screenings to include cognitive, depression, and falls Referrals and appointments  In addition, I have reviewed and discussed with patient certain preventive protocols,  quality metrics, and best practice recommendations. A written personalized care plan for preventive services as well as general preventive health recommendations were  provided to patient.     Harlene MARLA An, NP   06/15/2024

## 2024-06-15 NOTE — Progress Notes (Signed)
 Subjective:   Brittany Bryant is a 89 y.o. female who presents for Medicare Annual (Subsequent) preventive examination.  Visit Complete: In person [facility] SN[facility]   Cardiac Risk Factors include: sedentary lifestyle;advanced age (>60men, >47 women);hypertension     Objective:    There were no vitals filed for this visit. There is no height or weight on file to calculate BMI.     05/24/2024    2:02 PM 05/18/2024   11:54 AM 04/12/2024    1:45 PM 02/04/2024   10:22 AM 11/27/2023    9:27 AM 11/24/2023    3:41 PM 08/28/2023   11:27 AM  Advanced Directives  Does Patient Have a Medical Advance Directive? Yes Yes Yes No No No Yes  Type of Advance Directive Out of facility DNR (pink MOST or yellow form) Out of facility DNR (pink MOST or yellow form) Out of facility DNR (pink MOST or yellow form)      Does patient want to make changes to medical advance directive? No - Patient declined No - Patient declined No - Patient declined    No - Patient declined  Would patient like information on creating a medical advance directive?    No - Patient declined No - Patient declined No - Patient declined     Current Medications (verified) Outpatient Encounter Medications as of 06/15/2024  Medication Sig  . amLODipine  (NORVASC ) 5 MG tablet Take 5 mg by mouth daily.  . calcium carbonate (OSCAL) 1500 (600 Ca) MG TABS tablet Take 600 mg of elemental calcium by mouth 2 (two) times daily with a meal.  . Cholecalciferol (VITAMIN D3) 50 MCG (2000 UT) TABS Take 1 tablet by mouth daily.  . ferrous sulfate 325 (65 FE) MG EC tablet Take 325 mg by mouth. Once daily every Monday,Wednesday and Friday (Patient not taking: Reported on 05/24/2024)  . furosemide (LASIX) 40 MG tablet Take 40 mg by mouth daily.  . lisinopril  (ZESTRIL ) 40 MG tablet Take 40 mg by mouth daily.  . melatonin 5 MG TABS Take 5 mg by mouth at bedtime.  . metoprolol succinate (TOPROL-XL) 25 MG 24 hr tablet Take 1 tablet by mouth in the morning and at  bedtime.  . mirabegron ER (MYRBETRIQ) 25 MG TB24 tablet Take 25 mg by mouth at bedtime.  SABRA nystatin powder Apply 1 Application topically 2 (two) times daily.  . polyethylene glycol (MIRALAX / GLYCOLAX) 17 g packet Take 17 g by mouth daily.  . Sunscreen SPF50 LOTN Apply 1 Application topically every 2 (two) hours as needed (Sunburn Prevention).   No facility-administered encounter medications on file as of 06/15/2024.    Allergies (verified) Patient has no known allergies.   History: Past Medical History:  Diagnosis Date  . Anemia    Per [facility]  . Carpal tunnel syndrome    Per [facility]  . Difficulty walking    Per [facility]  . Gait disturbance   . Hypertension   . Low serum vitamin D   . Stage 3b chronic kidney disease (HCC)   . Tinnitus    Per [facility] Records   Past Surgical History:  Procedure Laterality Date  . APPENDECTOMY    . BACK SURGERY    . CATARACT EXTRACTION, BILATERAL    . CORNEAL TRANSPLANT    . TOTAL ABDOMINAL HYSTERECTOMY W/ BILATERAL SALPINGOOPHORECTOMY    . TUBAL LIGATION     Family History  Problem Relation Age of Onset  . Arthritis Father   .  COPD Father    Social History   Socioeconomic History  . Marital status: Widowed    Spouse name: Not on file  . Number of children: Not on file  . Years of education: Not on file  . Highest education level: Not on file  Occupational History  . 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 Smoking status: Never  . Smokeless tobacco: Never  Vaping Use  . Vaping status: Never Used  Substance and Sexual Activity  . Alcohol use: Yes    Comment: rare wine  . Drug use: Not Currently  . Sexual activity: Not on file  Other Topics Concern  . Not on file  Social History Narrative   Widowed ~2012   5 children      Has living will   Son Brittany Bryant is health care POA   Would accept resuscitation attempts   No feeding tube if cognitively  unaware   Social Drivers of Health   Financial Resource Strain: Not on file  Food Insecurity: Not on file  Transportation Needs: Not on file  Physical Activity: Not on file  Stress: Not on file  Social Connections: Not on file    Tobacco Counseling Counseling given: Not Answered   Clinical Intake:  Pre-visit preparation completed: Yes  Pain : No/denies pain     BMI - recorded: 23 Nutritional Status: BMI of 19-24  Normal Nutritional Risks: None Diabetes: No  How often do you need to have someone help you when you read instructions, pamphlets, or other written materials from your doctor or pharmacy?: 1 - Never         Activities of Daily Living    06/15/2024   12:41 PM  In your present state of health, do you have any difficulty performing the following activities:  Hearing? 0  Vision? 0  Difficulty concentrating or making decisions? 0  Walking or climbing stairs? 1  Dressing or bathing? 1  Doing errands, shopping? 1  Preparing Food and eating ? N  Using the Toilet? N  In the past six months, have you accidently leaked urine? Y  Do you have problems with loss of bowel control? Y  Managing your Medications? Y  Managing your Finances? Y  Housekeeping or managing your Housekeeping? Y    Patient Care Team: Abdul Fine, MD as PCP - General (Family Medicine) Caro Harlene POUR, NP as Nurse Practitioner (Geriatric Medicine)  Indicate any recent Medical Services you may have received from other than [facility] providers in the past year (date may be approximate).     Assessment:   This is a routine wellness examination for Brittany Bryant.  Hearing/Vision screen No results found.   Goals Addressed   None    Depression Screen    06/05/2023    2:44 PM  PHQ 2/9 Scores  PHQ - 2 Score 0    Fall Risk    10/20/2023    9:27 PM 06/05/2023    2:44 PM  Fall Risk   Falls in the past year? 1 0  Number falls in past yr: 1   Injury with Fall? 0   Risk for fall due  to : History of fall(s)   Follow up Falls evaluation completed     MEDICARE RISK AT HOME: Medicare Risk at Home Any stairs in or around the home?: No If so, are there any without handrails?: No Home free of loose throw rugs in walkways, pet beds, electrical cords, etc?:  Yes Adequate lighting in your home to reduce risk of falls?: Yes Life alert?: No Use of a cane, walker or w/c?: Yes Grab bars in the bathroom?: Yes Shower chair or bench in shower?: Yes Elevated toilet seat or a handicapped toilet?: Yes  TIMED UP AND GO:  Was the test performed?  No    Cognitive Function:        Immunizations Immunization History  Administered Date(s) Administered  . Fluad Quad(high Dose 65+) 09/17/2023  . Influenza Split 10/18/2015  . Influenza, High Dose Seasonal PF 09/10/2022  . Influenza-Unspecified 09/24/2016  . Moderna Covid-19 Fall Seasonal Vaccine 39yrs & older 03/04/2023  . Moderna Covid-19 Vaccine Bivalent Booster 45yrs & up 04/12/2021, 08/17/2021  . Moderna SARS-COV2 Booster Vaccination 10/10/2020  . Moderna Sars-Covid-2 Vaccination 11/30/2019, 01/07/2020  . PNEUMOCOCCAL CONJUGATE-20 09/06/2022  . Pneumococcal Conjugate-13 10/30/2016, 12/24/2018  . Pneumococcal Polysaccharide-23 11/29/2005  . Pneumococcal-Unspecified 11/25/2005  . Tdap 10/29/2011, 07/21/2022  . Unspecified SARS-COV-2 Vaccination 08/22/2023  . Zoster Recombinant(Shingrix) 02/25/2018, 06/15/2018  . Zoster, Live 02/23/2006    TDAP status: Up to date  Flu Vaccine status: Up to date  Pneumococcal vaccine status: Up to date  Covid-19 vaccine status: Information provided on how to obtain vaccines.   Qualifies for Shingles Vaccine? Yes   Zostavax completed No   Shingrix Completed?: Yes  Screening Tests Health Maintenance  Topic Date Due  . COVID-19 Vaccine (8 - 2024-25 season) 02/19/2024  . Medicare Annual Wellness (AWV)  06/04/2024  . INFLUENZA VACCINE  06/25/2024  . DTaP/Tdap/Td (3 - Td or Tdap)  07/21/2032  . Pneumococcal Vaccine: 50+ Years  Completed  . DEXA SCAN  Completed  . Zoster Vaccines- Shingrix  Completed  . Hepatitis B Vaccines  Aged Out  . HPV VACCINES  Aged Out  . Meningococcal B Vaccine  Aged Out    Health Maintenance  Health Maintenance Due  Topic Date Due  . COVID-19 Vaccine (8 - 2024-25 season) 02/19/2024  . Medicare Annual Wellness (AWV)  06/04/2024    Colorectal cancer screening: No longer required.   Mammogram status: No longer required due to age.   Lung Cancer Screening: (Low Dose CT Chest recommended if Age 24-80 years, 20 pack-year currently smoking OR have quit w/in 15years.) does not qualify.   Lung Cancer Screening Referral: na  Additional Screening:  Hepatitis C Screening: does not qualify  Vision Screening: Recommended annual ophthalmology exams for early detection of glaucoma and other disorders of the eye. Is the patient up to date with their annual eye exam?  Yes  Who is the provider or what is the name of the office in which the patient attends annual eye exams? [facility] If pt is not established with a provider, would they like to be referred to a provider to establish care? No .   Dental Screening: Recommended annual dental exams for proper oral hygiene  Community Resource Referral / Chronic Care Management: CRR required this visit?  No   CCM required this visit?  No     Plan:     I have personally reviewed and noted the following in the patient's chart:   Medical and social history Use of alcohol, tobacco or illicit drugs  Current medications and supplements including opioid prescriptions. Patient is not currently taking opioid prescriptions. Functional ability and status Nutritional status Physical activity Advanced directives List of other physicians Hospitalizations, surgeries, and ER visits in previous 12 months Vitals Screenings to include cognitive, depression, and falls Referrals and appointments  In  addition, I have reviewed and discussed with patient certain preventive protocols, quality metrics, and best practice recommendations. A written personalized care plan for preventive services as well as general preventive health recommendations were provided to patient.     Harlene MARLA An, NP   06/15/2024        This encounter was created in error - please disregard.

## 2024-07-01 ENCOUNTER — Encounter: Payer: Self-pay | Admitting: Nurse Practitioner

## 2024-07-01 ENCOUNTER — Non-Acute Institutional Stay (SKILLED_NURSING_FACILITY): Payer: Self-pay | Admitting: Nurse Practitioner

## 2024-07-01 DIAGNOSIS — M810 Age-related osteoporosis without current pathological fracture: Secondary | ICD-10-CM

## 2024-07-01 DIAGNOSIS — N3281 Overactive bladder: Secondary | ICD-10-CM | POA: Diagnosis not present

## 2024-07-01 DIAGNOSIS — I5032 Chronic diastolic (congestive) heart failure: Secondary | ICD-10-CM | POA: Diagnosis not present

## 2024-07-01 DIAGNOSIS — K5904 Chronic idiopathic constipation: Secondary | ICD-10-CM

## 2024-07-01 DIAGNOSIS — D509 Iron deficiency anemia, unspecified: Secondary | ICD-10-CM

## 2024-07-01 DIAGNOSIS — I351 Nonrheumatic aortic (valve) insufficiency: Secondary | ICD-10-CM | POA: Diagnosis not present

## 2024-07-01 DIAGNOSIS — I10 Essential (primary) hypertension: Secondary | ICD-10-CM

## 2024-07-01 NOTE — Progress Notes (Signed)
 Location:  Other [facility].  Nursing Home Room Number: [facility] [id number] Place of Service:  SNF [phone]) Harlene An, NP  PCP: Abdul Fine, MD  Patient Care Team: Abdul Fine, MD as PCP - General (Family Medicine) An Harlene POUR, NP as Nurse Practitioner (Geriatric Medicine)  Extended Emergency Contact Information Primary Emergency Contact: [facility] Address: [address]          [place], [place] [address] [place] Home Phone: [phone] Mobile Phone: [phone] Relation: Son Secondary Emergency Contact: Grist,[name] Address: [address]          [place], [place] [address] [place] Work Phone: [phone] Mobile Phone: [phone] Relation: Son  Goals of care: Advanced Directive information    05/24/2024    2:02 PM  Advanced Directives  Does Patient Have a Medical Advance Directive? Yes  Type of Advance Directive Out of facility DNR (pink MOST or yellow form)  Does patient want to make changes to medical advance directive? No - Patient declined     Chief Complaint  Patient presents with   Medical Management of Chronic Issues    Medical Management of Chronic Issues.     HPI:  Pt is a 89 y.o. female seen today for medical management of chronic disease. Pt with hx of CHF, htn, MVR, overactive bladder  Reports she was given a fluid pill yesterday for weight gain but no one told her she was getting it so she is upset about this. States she was in the bathroom all morning and was not prepared.  Enjoys being active and going to exercise classes routinely.  Denies shortness of breath, chest pain or LE edema.  Reports other than being upset about the increase in urination is generally in good sprites and denies anxiety or depression.  Staff has no acute concerns today.   Past Medical History:  Diagnosis Date   Anemia    Per [facility] Records   Carpal tunnel syndrome    Per [facility] Records   Difficulty walking     Per [employer] [facility] Records   Gait disturbance    Hypertension    Low serum vitamin D    Stage 3b chronic kidney disease (HCC)    Tinnitus    Per [facility] Records   Past Surgical History:  Procedure Laterality Date   APPENDECTOMY     BACK SURGERY     CATARACT EXTRACTION, BILATERAL     CORNEAL TRANSPLANT     TOTAL ABDOMINAL HYSTERECTOMY W/ BILATERAL SALPINGOOPHORECTOMY     TUBAL LIGATION      No Known Allergies  Outpatient Encounter Medications as of 07/01/2024  Medication Sig   amLODipine  (NORVASC ) 5 MG tablet Take 5 mg by mouth daily.   calcium carbonate (OSCAL) 1500 (600 Ca) MG TABS tablet Take 600 mg of elemental calcium by mouth 2 (two) times daily with a meal.   Cholecalciferol (VITAMIN D3) 50 MCG (2000 UT) TABS Take 1 tablet by mouth daily.   furosemide (LASIX) 40 MG tablet Take 40 mg by mouth daily.   lisinopril  (ZESTRIL ) 40 MG tablet Take 40 mg by mouth daily.   melatonin 5 MG TABS Take 5 mg by mouth at bedtime.   metoprolol succinate (TOPROL-XL) 25 MG 24 hr tablet Take 1 tablet by mouth in the morning and at bedtime.   mirabegron ER (MYRBETRIQ) 25 MG TB24 tablet Take 25 mg by mouth at bedtime.   polyethylene glycol (MIRALAX / GLYCOLAX) 17 g packet Take  17 g by mouth daily. (Patient taking differently: Take 17 g by mouth at bedtime. Every Monday,Wednesday and Friday)   Sunscreen SPF50 LOTN Apply 1 Application topically every 2 (two) hours as needed (Sunburn Prevention).   ferrous sulfate 325 (65 FE) MG EC tablet Take 325 mg by mouth. Once daily every Monday,Wednesday and Friday (Patient not taking: Reported on 07/01/2024)   nystatin powder Apply 1 Application topically 2 (two) times daily. (Patient not taking: Reported on 07/01/2024)   No facility-administered encounter medications on file as of 07/01/2024.    Review of Systems  Constitutional:  Negative for activity change, appetite change, fatigue and unexpected weight change.  HENT:  Negative for congestion and hearing loss.    Eyes: Negative.   Respiratory:  Negative for cough and shortness of breath.   Cardiovascular:  Negative for chest pain, palpitations and leg swelling.  Gastrointestinal:  Negative for abdominal pain, constipation and diarrhea.  Genitourinary:  Negative for difficulty urinating and dysuria.  Musculoskeletal:  Negative for arthralgias and myalgias.  Skin:  Negative for color change and wound.  Neurological:  Negative for dizziness and weakness.  Psychiatric/Behavioral:  Negative for agitation, behavioral problems and confusion.      Immunization History  Administered Date(s) Administered   Fluad Quad(high Dose 65+) 09/17/2023   Influenza Split 10/18/2015   Influenza, High Dose Seasonal PF 09/10/2022   Influenza-Unspecified 09/24/2016   Moderna Covid-19 Fall Seasonal Vaccine 67yrs & older 03/04/2023   Moderna Covid-19 Vaccine Bivalent Booster 27yrs & up 04/12/2021, 08/17/2021   Moderna SARS-COV2 Booster Vaccination 10/10/2020   Moderna Sars-Covid-2 Vaccination 11/30/2019, 01/07/2020   PNEUMOCOCCAL CONJUGATE-20 09/06/2022   Pneumococcal Conjugate-13 10/30/2016, 12/24/2018   Pneumococcal Polysaccharide-23 11/29/2005   Pneumococcal-Unspecified 11/25/2005   Tdap 10/29/2011, 07/21/2022   Unspecified SARS-COV-2 Vaccination 08/22/2023   Zoster Recombinant(Shingrix) 02/25/2018, 06/15/2018   Zoster, Live 02/23/2006   Pertinent  Health Maintenance Due  Topic Date Due   INFLUENZA VACCINE  06/25/2024   DEXA SCAN  Completed      04/16/2021   12:12 PM 07/21/2022    6:58 PM 07/22/2022    1:12 AM 06/05/2023    2:44 PM 10/20/2023    9:27 PM  Fall Risk  Falls in the past year?    0 1  Was there an injury with Fall?     0  Fall Risk Category Calculator     2  (RETIRED) Patient Fall Risk Level High fall risk  Low fall risk  Low fall risk     Patient at Risk for Falls Due to     History of fall(s)  Fall risk Follow up     Falls evaluation completed     Data saved with a previous flowsheet row  definition   Functional Status Survey:    Vitals:   07/01/24 0923  BP: 120/73  Pulse: 60  Resp: 18  Temp: 98.5 F (36.9 C)  SpO2: 93%  Weight: 133 lb (60.3 kg)  Height: 5' 3 (1.6 m)   Body mass index is 23.56 kg/m. Physical Exam Constitutional:      General: She is not in acute distress.    Appearance: She is well-developed. She is not diaphoretic.  HENT:     Head: Normocephalic and atraumatic.     Mouth/Throat:     Pharynx: No oropharyngeal exudate.  Eyes:     Conjunctiva/sclera: Conjunctivae normal.     Pupils: Pupils are equal, round, and reactive to light.  Cardiovascular:     Rate and  Rhythm: Normal rate and regular rhythm.     Heart sounds: Normal heart sounds.  Pulmonary:     Effort: Pulmonary effort is normal.     Breath sounds: Normal breath sounds.  Abdominal:     General: Bowel sounds are normal.     Palpations: Abdomen is soft.  Musculoskeletal:     Cervical back: Normal range of motion and neck supple.     Right lower leg: No edema.     Left lower leg: No edema.  Skin:    General: Skin is warm and dry.  Neurological:     Mental Status: She is alert and oriented to person, place, and time.     Motor: Weakness present.     Gait: Gait abnormal.  Psychiatric:        Mood and Affect: Mood normal.     Labs reviewed: Recent Labs    03/18/24 0000 04/15/24 0000 04/26/24 0000  NA 137 140 139  K 3.8 3.7 3.7  CL 98* 99 101  CO2 32* 34* 32*  BUN 27* 28* 27*  CREATININE 1.4* 1.1 1.0  CALCIUM 8.8 8.9 8.8   Recent Labs    08/07/23 0000  AST 18  ALT 9  ALKPHOS 72  ALBUMIN 3.8   Recent Labs    01/26/24 0000 04/15/24 0000 04/26/24 0000 05/18/24 0000  WBC 4.9 6.3 6.4 5.6  NEUTROABS 2,426.00 3,780.00 3,878.00  --   HGB 11.2* 10.8* 10.7* 11.4*  HCT 33* 32* 33* 35*  PLT 236 230 233 254   No results found for: TSH No results found for: HGBA1C No results found for: CHOL, HDL, LDLCALC, LDLDIRECT, TRIG, CHOLHDL  Significant  Diagnostic Results in last 30 days:  No results found.  Assessment/Plan OAB (overactive bladder) Continues on myrbetriq  Nonrheumatic aortic valve insufficiency Stable, no symptoms at this time.   Essential hypertension Blood pressure well controlled, goal bp <140/90 Continue current medications and dietary modifications follow metabolic panel  Chronic idiopathic constipation Controlled on miralax  Chronic heart failure with preserved ejection fraction (HCC) Euvolemic, continues on lisinopril , metoprolol and lasix  Anemia Ongoing, follow up labs schedule. No signs of bleeding.  Age-related osteoporosis without current pathological fracture Continues on cal and vit d supplement      Avelyn Touch K. Caro BODILY [facility] [phone]

## 2024-07-06 NOTE — Assessment & Plan Note (Signed)
 Ongoing, follow up labs schedule. No signs of bleeding.

## 2024-07-06 NOTE — Assessment & Plan Note (Signed)
 Controlled on miralax

## 2024-07-06 NOTE — Assessment & Plan Note (Signed)
 Stable, no symptoms at this time.

## 2024-07-06 NOTE — Assessment & Plan Note (Signed)
 Blood pressure well controlled, goal bp <140/90 Continue current medications and dietary modifications follow metabolic panel

## 2024-07-06 NOTE — Assessment & Plan Note (Signed)
 Continues on cal and vit d supplement

## 2024-07-06 NOTE — Assessment & Plan Note (Signed)
 Euvolemic, continues on lisinopril , metoprolol and lasix

## 2024-07-06 NOTE — Assessment & Plan Note (Signed)
 Continues on myrbetriq

## 2024-07-12 LAB — HEPATIC FUNCTION PANEL
ALT: 12 U/L (ref 7–35)
AST: 17 (ref 13–35)
Alkaline Phosphatase: 62 (ref 25–125)
Bilirubin, Total: 0.6

## 2024-07-12 LAB — CBC AND DIFFERENTIAL
HCT: 32 — AB (ref 36–46)
Hemoglobin: 10.8 — AB (ref 12.0–16.0)
Neutrophils Absolute: 4968
Platelets: 274 K/uL (ref 150–400)
WBC: 7.2

## 2024-07-12 LAB — BASIC METABOLIC PANEL WITH GFR
BUN: 30 — AB (ref 4–21)
CO2: 37 — AB (ref 13–22)
Chloride: 91 — AB (ref 99–108)
Creatinine: 1.4 — AB (ref 0.5–1.1)
Glucose: 81
Potassium: 3.4 meq/L — AB (ref 3.5–5.1)
Sodium: 134 — AB (ref 137–147)

## 2024-07-12 LAB — COMPREHENSIVE METABOLIC PANEL WITH GFR
Albumin: 3.3 — AB (ref 3.5–5.0)
Globulin: 2.4

## 2024-07-12 LAB — CBC: RBC: 3.22 — AB (ref 3.87–5.11)

## 2024-07-14 ENCOUNTER — Encounter: Payer: Self-pay | Admitting: Student

## 2024-07-14 ENCOUNTER — Non-Acute Institutional Stay (SKILLED_NURSING_FACILITY): Payer: Self-pay | Admitting: Student

## 2024-07-14 DIAGNOSIS — F5101 Primary insomnia: Secondary | ICD-10-CM

## 2024-07-14 DIAGNOSIS — I5032 Chronic diastolic (congestive) heart failure: Secondary | ICD-10-CM

## 2024-07-14 DIAGNOSIS — N1831 Chronic kidney disease, stage 3a: Secondary | ICD-10-CM | POA: Diagnosis not present

## 2024-07-14 DIAGNOSIS — R531 Weakness: Secondary | ICD-10-CM | POA: Diagnosis not present

## 2024-07-14 NOTE — Progress Notes (Signed)
 Location:  Other [facility].  Nursing Home Room Number: [facility] [id number] Place of Service:  SNF [phone]) Provider:  Abdul Fine, MD  Patient Care Team: Abdul Fine, MD as PCP - General (Family Medicine) Caro Harlene POUR, NP as Nurse Practitioner (Geriatric Medicine)  Extended Emergency Contact Information Primary Emergency Contact: [facility] Address: [address]          [place], [place] [address] [place] Home Phone: [phone] Mobile Phone: [phone] Relation: Son Secondary Emergency Contact: Oltmann,[name] Address: [address]          [place], [place] [address] [place] Work Phone: [phone] Mobile Phone: [phone] Relation: Son  Code Status:  DNR Goals of care: Advanced Directive information    05/24/2024    2:02 PM  Advanced Directives  Does Patient Have a Medical Advance Directive? Yes  Type of Advance Directive Out of facility DNR (pink MOST or yellow form)  Does patient want to make changes to medical advance directive? No - Patient declined     Chief Complaint  Patient presents with   Medical Management of Chronic Issues    Medical Management of Chronic Issues.     HPI:  Pt is a 89 y.o. female seen today for medical management of chronic diseases.  History of Present Illness The patient, with chronic kidney disease stage 3A, presents with weakness and confusion.  She experiences significant weakness and confusion, with an inability to stand and episodes of nonsensical speech. There is a noted change in her usual positive demeanor, with recent expressions of wanting to die, particularly after a distressing night.  She has chronic kidney disease stage 3A and has been on furosemide. Her kidney function has declined, and her medications have been adjusted, including increased potassium and cessation of calcium.  She describes discomfort during urination, with fluid 'gushing out' but no burning sensation. She  feels too weak to participate in physical therapy, unable to perform any activities during sessions.  She struggles with hydration, drinking a lot of water but still feeling weak. She takes numerous medications but is unsure of specifics. She reports poor sleep and is uncertain about using sleep aids.  She has a son who is a Education officer, community and is building a house, but she has difficulty contacting him. She also has two children living in [place].  Social History - Living Situation: Currently receiving care at a facility. - The patient has children, including a son who is a Education officer, community and two children living in [place].   Past Medical History:  Diagnosis Date   Anemia    Per [facility] Records   Carpal tunnel syndrome    Per [facility] Records   Difficulty walking    Per [employer] [facility] Records   Gait disturbance    Hypertension    Low serum vitamin D    Stage 3b chronic kidney disease (HCC)    Tinnitus    Per [facility] Records   Past Surgical History:  Procedure Laterality Date   APPENDECTOMY     BACK SURGERY     CATARACT EXTRACTION, BILATERAL     CORNEAL TRANSPLANT     TOTAL ABDOMINAL HYSTERECTOMY W/ BILATERAL SALPINGOOPHORECTOMY     TUBAL LIGATION      No Known Allergies  Outpatient Encounter Medications as of 07/14/2024  Medication Sig   amLODipine  (NORVASC ) 5 MG tablet Take 5 mg by mouth daily.   calcium carbonate (OSCAL) 1500 (600 Ca) MG TABS tablet Take  600 mg of elemental calcium by mouth 2 (two) times daily with a meal.   Cholecalciferol (VITAMIN D3) 50 MCG (2000 UT) TABS Take 1 tablet by mouth daily.   furosemide (LASIX) 40 MG tablet Take 40 mg by mouth daily.   lisinopril  (ZESTRIL ) 40 MG tablet Take 40 mg by mouth daily.   melatonin 5 MG TABS Take 5 mg by mouth at bedtime.   metoprolol succinate (TOPROL-XL) 25 MG 24 hr tablet Take 1 tablet by mouth in the morning and at bedtime.   mirabegron ER (MYRBETRIQ) 25 MG TB24 tablet Take 25 mg by mouth at bedtime.    polyethylene glycol (MIRALAX / GLYCOLAX) 17 g packet Take 17 g by mouth daily. (Patient taking differently: Take 17 g by mouth. At bedtime every Mon,Wed, Fri)   potassium chloride (KLOR-CON) 20 MEQ packet Take 20 mEq by mouth daily.   Sunscreen SPF50 LOTN Apply 1 Application topically every 2 (two) hours as needed (Sunburn Prevention).   No facility-administered encounter medications on file as of 07/14/2024.    Review of Systems  Immunization History  Administered Date(s) Administered   Fluad Quad(high Dose 65+) 09/17/2023   Influenza Split 10/18/2015   Influenza, High Dose Seasonal PF 09/10/2022   Influenza-Unspecified 09/24/2016   Moderna Covid-19 Fall Seasonal Vaccine 54yrs & older 03/04/2023   Moderna Covid-19 Vaccine Bivalent Booster 74yrs & up 04/12/2021, 08/17/2021   Moderna SARS-COV2 Booster Vaccination 10/10/2020   Moderna Sars-Covid-2 Vaccination 11/30/2019, 01/07/2020   PNEUMOCOCCAL CONJUGATE-20 09/06/2022   Pneumococcal Conjugate-13 10/30/2016, 12/24/2018   Pneumococcal Polysaccharide-23 11/29/2005   Pneumococcal-Unspecified 11/25/2005   Tdap 10/29/2011, 07/21/2022   Unspecified SARS-COV-2 Vaccination 08/22/2023   Zoster Recombinant(Shingrix) 02/25/2018, 06/15/2018   Zoster, Live 02/23/2006   Pertinent  Health Maintenance Due  Topic Date Due   INFLUENZA VACCINE  06/25/2024   DEXA SCAN  Completed      04/16/2021   12:12 PM 07/21/2022    6:58 PM 07/22/2022    1:12 AM 06/05/2023    2:44 PM 10/20/2023    9:27 PM  Fall Risk  Falls in the past year?    0 1  Was there an injury with Fall?     0  Fall Risk Category Calculator     2  (RETIRED) Patient Fall Risk Level High fall risk  Low fall risk  Low fall risk     Patient at Risk for Falls Due to     History of fall(s)  Fall risk Follow up     Falls evaluation completed     Data saved with a previous flowsheet row definition   Functional Status Survey:    Vitals:   07/14/24 1102  BP: 124/64  Pulse: 60  Resp:  20  Temp: 98.2 F (36.8 C)  SpO2: 94%  Weight: 126 lb 4.8 oz (57.3 kg)  Height: 5' 3 (1.6 m)   Body mass index is 22.37 kg/m. Physical Exam  Physical Exam CHEST: Lungs clear to auscultation bilaterally. Results LABS Potassium: hypokalemia Kidney function: renal impairment Labs reviewed: Recent Labs    03/18/24 0000 04/15/24 0000 04/26/24 0000 07/12/24 0000  NA 137 140 139 134*  K 3.8 3.7 3.7 3.4*  CL 98* 99 101 91*  CO2 32* 34* 32* 37*  BUN 27* 28* 27* 30*  CREATININE 1.4* 1.1 1.0 1.4*  CALCIUM 8.8 8.9 8.8  --    Recent Labs    08/07/23 0000 07/12/24 0000  AST 18 17  ALT 9 12  ALKPHOS 72  62  ALBUMIN 3.8 3.3*   Recent Labs    04/15/24 0000 04/26/24 0000 05/18/24 0000 07/12/24 0000  WBC 6.3 6.4 5.6 7.2  NEUTROABS 3,780.00 3,878.00  --  4,968.00  HGB 10.8* 10.7* 11.4* 10.8*  HCT 32* 33* 35* 32*  PLT 230 233 254 274   No results found for: TSH No results found for: HGBA1C No results found for: CHOL, HDL, LDLCALC, LDLDIRECT, TRIG, CHOLHDL  Significant Diagnostic Results in last 30 days:  No results found.  Assessment/Plan Chronic kidney disease stage 3A Chronic kidney disease stage 3A with recent decline in kidney function, likely exacerbated by overuse of furosemide, leading to excessive fluid removal and weakness. Symptoms include generalized weakness and confusion, with difficulty standing and nonsensical speech. Labs indicate decreased kidney function and potential hypokalemia. - Decrease frequency of furosemide administration - Encourage oral fluid intake to improve hydration - Increase potassium intake to address potential hypokalemia  Generalized weakness and confusion Generalized weakness and confusion likely secondary to electrolyte imbalance and dehydration from excessive diuretic use. Symptoms include difficulty standing, nonsensical speech, and inability to participate in physical therapy. She reports feeling weak and unable  to lift objects, with a desire to improve strength and participate in therapy. - Encourage oral fluid intake to improve hydration - Increase potassium intake to address potential hypokalemia  CHF Patient is urinating excessively due to diuretic. Appears dry on exam. Will change frequency to MWF for diuretic .  Insomnia Reports of poor sleep quality and difficulty sleeping through the night. Currently taking medication for sleep, but effectiveness is uncertain. She declined additional sleep medication at this time. - Review current sleep medication regimen - Consider adjusting sleep medication if necessary  Follow-Up Plans to communicate with family members regarding current health status and care plan. Her son, a Education officer, community, is difficult to reach due to his schedule. - Contact her children to discuss current health status and care plan  Family/ staff Communication: nursing  Labs/tests ordered:  none

## 2024-07-16 ENCOUNTER — Non-Acute Institutional Stay: Payer: Self-pay | Admitting: Student

## 2024-07-16 DIAGNOSIS — E860 Dehydration: Secondary | ICD-10-CM | POA: Diagnosis not present

## 2024-07-16 DIAGNOSIS — I5032 Chronic diastolic (congestive) heart failure: Secondary | ICD-10-CM | POA: Diagnosis not present

## 2024-07-16 DIAGNOSIS — R410 Disorientation, unspecified: Secondary | ICD-10-CM

## 2024-07-16 DIAGNOSIS — N1831 Chronic kidney disease, stage 3a: Secondary | ICD-10-CM

## 2024-07-16 DIAGNOSIS — K5904 Chronic idiopathic constipation: Secondary | ICD-10-CM | POA: Diagnosis not present

## 2024-07-18 ENCOUNTER — Encounter: Payer: Self-pay | Admitting: Student

## 2024-07-18 NOTE — Progress Notes (Signed)
 [facility]  Provider: ABDUL  Code Status: DNR Goals of Care:     05/24/2024    2:02 PM  Advanced Directives  Does Patient Have a Medical Advance Directive? Yes  Type of Advance Directive Out of facility DNR (pink MOST or yellow form)  Does patient want to make changes to medical advance directive? No - Patient declined     No chief complaint on file.   HPI: Patient is a 89 y.o. female seen today for an acute visit. Discussed the use of AI scribe software for clinical note transcription with the patient, who gave verbal consent to proceed.  History of Present Illness   History of Present Illness The patient is a 89 year old with hypercalcemia who presents with weakness and confusion.  She has been experiencing increasing weakness, now unable to stand or walk independently, requiring assistance from a lift apparatus for transfers. Over the past six months, she has been unable to use the toilet by herself, which marks a significant decline in her functional status. She wants to regain her strength and mobility.  She has a history of hypercalcemia, with recent blood work confirming elevated calcium levels. Despite taking furosemide (Lasix) for fluid retention, her calcium levels remain high. She experiences increased confusion and constipation, which are associated with the hypercalcemia.  She takes a diuretic for heart issues. She mentions having a good appetite today, enjoying her lunch, and drinking plenty of water.  No joint pain or headaches. She reports constipation and confusion. Her speech has been different recently.  She has a history of skin cancer on her shin, which has been present for approximately nine months and has not been treated. She previously declined intervention for this lesion  Social History Alcohol: Patient is given wine, possibly as part of her care routine. Living Situation: Patient resides in a care  facility. Patient has family dynamics involving her sons, with some tension noted between them.  Family History - Mother: hypercalcemia, osteoporosis, bone metastasis, heart failure, skin cancer, deceased at age   Past Medical History:  Diagnosis Date   Anemia    Per [facility] Records   Carpal tunnel syndrome    Per [facility] Records   Difficulty walking    Per [facility] Records   Gait disturbance    Hypertension    Low serum vitamin D    Stage 3b chronic kidney disease (HCC)    Tinnitus    Per [facility] Records    Past Surgical History:  Procedure Laterality Date   APPENDECTOMY     BACK SURGERY     CATARACT EXTRACTION, BILATERAL     CORNEAL TRANSPLANT     TOTAL ABDOMINAL HYSTERECTOMY W/ BILATERAL SALPINGOOPHORECTOMY     TUBAL LIGATION      No Known Allergies  Outpatient Encounter Medications as of 07/16/2024  Medication Sig   amLODipine  (NORVASC ) 5 MG tablet Take 5 mg by mouth daily.   calcium carbonate (OSCAL) 1500 (600 Ca) MG TABS tablet Take 600 mg of elemental calcium by mouth 2 (two) times daily with a meal.   Cholecalciferol (VITAMIN D3) 50 MCG (2000 UT) TABS Take 1 tablet by mouth daily.   furosemide (LASIX) 40 MG tablet Take 40 mg by mouth daily.   lisinopril  (ZESTRIL ) 40 MG tablet Take 40 mg by mouth daily.   melatonin 5 MG TABS Take 5 mg by mouth at bedtime.   metoprolol succinate (TOPROL-XL) 25 MG  24 hr tablet Take 1 tablet by mouth in the morning and at bedtime.   mirabegron ER (MYRBETRIQ) 25 MG TB24 tablet Take 25 mg by mouth at bedtime.   polyethylene glycol (MIRALAX / GLYCOLAX) 17 g packet Take 17 g by mouth daily. (Patient taking differently: Take 17 g by mouth. At bedtime every Mon,Wed, Fri)   potassium chloride (KLOR-CON) 20 MEQ packet Take 20 mEq by mouth daily.   Sunscreen SPF50 LOTN Apply 1 Application topically every 2 (two) hours as needed (Sunburn Prevention).   No facility-administered encounter medications on file as of 07/16/2024.     Review of Systems:  Review of Systems  Health Maintenance  Topic Date Due   COVID-19 Vaccine (8 - 2024-25 season) 02/19/2024   INFLUENZA VACCINE  06/25/2024   Medicare Annual Wellness (AWV)  06/15/2025   DTaP/Tdap/Td (3 - Td or Tdap) 07/21/2032   Pneumococcal Vaccine: 50+ Years  Completed   DEXA SCAN  Completed   Zoster Vaccines- Shingrix  Completed   HPV VACCINES  Aged Out   Meningococcal B Vaccine  Aged Out    Physical Exam: There were no vitals filed for this visit. There is no height or weight on file to calculate BMI. Physical Exam CHEST: Lungs clear to auscultation. SKIN: Skin is very dry.  Labs reviewed: Basic Metabolic Panel: Recent Labs    03/18/24 0000 04/15/24 0000 04/26/24 0000 07/12/24 0000  NA 137 140 139 134*  K 3.8 3.7 3.7 3.4*  CL 98* 99 101 91*  CO2 32* 34* 32* 37*  BUN 27* 28* 27* 30*  CREATININE 1.4* 1.1 1.0 1.4*  CALCIUM 8.8 8.9 8.8  --    Liver Function Tests: Recent Labs    08/07/23 0000 07/12/24 0000  AST 18 17  ALT 9 12  ALKPHOS 72 62  ALBUMIN 3.8 3.3*   No results for input(s): LIPASE, AMYLASE in the last 8760 hours. No results for input(s): AMMONIA in the last 8760 hours. CBC: Recent Labs    04/15/24 0000 04/26/24 0000 05/18/24 0000 07/12/24 0000  WBC 6.3 6.4 5.6 7.2  NEUTROABS 3,780.00 3,878.00  --  4,968.00  HGB 10.8* 10.7* 11.4* 10.8*  HCT 32* 33* 35* 32*  PLT 230 233 254 274   Lipid Panel: No results for input(s): CHOL, HDL, LDLCALC, TRIG, CHOLHDL, LDLDIRECT in the last 8760 hours. No results found for: HGBA1C  Procedures since last visit: No results found. 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 Differential includes osteoporosis with bone turnover, but low PTH is atypical for this. High calcium levels can cause confusion, constipation, and bone  pain. Patient and family prefer symptom management over invasive testing, but are open to a CT scan if necessary. - Start bisphosphonate (Fosamax) to help reabsorb calcium and manage hypercalcemia - continue loop diuretic. - Consider CT scan if bisphosphonate treatment does not improve calcium levels.  Heart failure with reduced ejection fraction Worsening heart failure with reduced ejection fraction, managed with diuretics. Recent dehydration may have exacerbated symptoms. - Continue diuretic therapy with adjustments to prevent dehydration.  Chronic kidney disease Chronic kidney disease with recent decline in function, possibly due to dehydration and diuretic use. Labs have not improved despite holding diuretics. - Monitor kidney function with repeat labs on Monday. - Ensure adequate hydration.  Constipation Constipation likely exacerbated by hypercalcemia. - Provide treatment to assist with bowel movements.  Dehydration Dehydration likely due  to diuretic use and inadequate fluid intake, contributing to confusion and kidney function decline. - Ensure adequate fluid intake.  Confusion Confusion possibly related to hypercalcemia and dehydration. - Address underlying hypercalcemia and dehydration to improve mental clarity.   Labs/tests ordered:  * No order type specified * Next appt:  Visit date not found

## 2024-07-18 NOTE — Progress Notes (Signed)
 This encounter was created in error - please disregard.

## 2024-07-29 LAB — BASIC METABOLIC PANEL WITH GFR
BUN: 20 (ref 4–21)
CO2: 27 — AB (ref 13–22)
Chloride: 99 (ref 99–108)
Creatinine: 0.9 (ref 0.5–1.1)
Glucose: 66
Potassium: 4.5 meq/L (ref 3.5–5.1)
Sodium: 133 — AB (ref 137–147)

## 2024-07-29 LAB — COMPREHENSIVE METABOLIC PANEL WITH GFR
Calcium: 8.6 — AB (ref 8.7–10.7)
eGFR: 58

## 2024-07-29 LAB — CBC: RBC: 3.41 — AB (ref 3.87–5.11)

## 2024-07-29 LAB — CBC AND DIFFERENTIAL
HCT: 34 — AB (ref 36–46)
Hemoglobin: 11.2 — AB (ref 12.0–16.0)
Neutrophils Absolute: 3454
Platelets: 262 K/uL (ref 150–400)
WBC: 5.7

## 2024-09-06 LAB — BASIC METABOLIC PANEL WITH GFR
BUN: 27 — AB (ref 4–21)
CO2: 31 — AB (ref 13–22)
Chloride: 101 (ref 99–108)
Creatinine: 1.1 (ref 0.5–1.1)
Glucose: 76
Potassium: 4.2 meq/L (ref 3.5–5.1)
Sodium: 136 — AB (ref 137–147)

## 2024-09-06 LAB — COMPREHENSIVE METABOLIC PANEL WITH GFR
Calcium: 8.4 — AB (ref 8.7–10.7)
eGFR: 50

## 2024-09-07 ENCOUNTER — Encounter: Payer: Self-pay | Admitting: Orthopedic Surgery

## 2024-09-07 ENCOUNTER — Non-Acute Institutional Stay: Payer: Self-pay | Admitting: Orthopedic Surgery

## 2024-09-07 NOTE — Progress Notes (Unsigned)
 Location:  Other [facility]) Nursing Home Room Number: 205 [id number] Place of Service:  SNF (31) Provider:  Greig CHARLENA Cluster, NP   Patient Care Team: Abdul Fine, MD as PCP - General (Family Medicine) Caro Harlene POUR, NP as Nurse Practitioner (Geriatric Medicine)  Extended Emergency Contact Information Primary Emergency Contact: [facility] Address: [address]          [place], [place] [address] [place] Home Phone: [phone] Mobile Phone: [phone] Relation: Son Secondary Emergency Contact: Gruver,[name] Address: [address]          [place], [place] [address] [place] Work Phone: [phone] Mobile Phone: [phone] Relation: Son  Code Status:  DNR Goals of care: Advanced Directive information    05/24/2024    2:02 PM  Advanced Directives  Does Patient Have a Medical Advance Directive? Yes  Type of Advance Directive Out of facility DNR (pink MOST or yellow form)  Does patient want to make changes to medical advance directive? No - Patient declined     Chief Complaint  Patient presents with   Medical Management of Chronic Issues    Routine visit. Discuss need for flu and covid vaccines     HPI:  Pt is a 89 y.o. female seen today for medical management of chronic diseases.     Past Medical History:  Diagnosis Date   Anemia    Per [facility] Records   Carpal tunnel syndrome    Per [facility] Records   Difficulty walking    Per [facility] Records   Gait disturbance    Hypertension    Low serum vitamin D    Stage 3b chronic kidney disease (HCC)    Tinnitus    Per [facility] Records   Past Surgical History:  Procedure Laterality Date   APPENDECTOMY     BACK SURGERY     CATARACT EXTRACTION, BILATERAL     CORNEAL TRANSPLANT     TOTAL ABDOMINAL HYSTERECTOMY W/ BILATERAL SALPINGOOPHORECTOMY     TUBAL LIGATION      No Known Allergies  Outpatient Encounter Medications as of 09/07/2024  Medication Sig   alendronate  (FOSAMAX) 70 MG tablet Take 70 mg by mouth once a week. Take with a full glass of water on an empty stomach. Every Wednesday   amLODipine  (NORVASC ) 5 MG tablet Take 5 mg by mouth daily.   Cholecalciferol (VITAMIN D3) 50 MCG (2000 UT) TABS Take 1 tablet by mouth daily.   cyanocobalamin (VITAMIN B12) 1000 MCG tablet Take 1,000 mcg by mouth daily.   furosemide (LASIX) 40 MG tablet Take 40 mg by mouth daily.   lisinopril  (ZESTRIL ) 40 MG tablet Take 40 mg by mouth daily.   melatonin 5 MG TABS Take 5 mg by mouth at bedtime.   metoprolol succinate (TOPROL-XL) 25 MG 24 hr tablet Take 1 tablet by mouth in the morning and at bedtime.   mirabegron ER (MYRBETRIQ) 25 MG TB24 tablet Take 25 mg by mouth at bedtime.   polyethylene glycol (MIRALAX / GLYCOLAX) 17 g packet Take 17 g by mouth every Monday, Wednesday, and Friday. Hold for loose stools   potassium chloride (KLOR-CON) 20 MEQ packet Take 20 mEq by mouth every other day.   Sunscreen SPF50 LOTN Apply 1 Application topically every 2 (two) hours as needed (Sunburn Prevention).   calcium carbonate (OSCAL) 1500 (600 Ca) MG TABS tablet Take 600 mg of elemental calcium by mouth 2 (two) times daily with a meal. (Patient  not taking: Reported on 09/07/2024)   No facility-administered encounter medications on file as of 09/07/2024.    Review of Systems  Immunization History  Administered Date(s) Administered   Fluad Quad(high Dose 65+) 09/17/2023   INFLUENZA, HIGH DOSE SEASONAL PF 09/10/2022   Influenza Split 10/18/2015   Influenza-Unspecified 09/24/2016   Moderna Covid-19 Fall Seasonal Vaccine 46yrs & older 03/04/2023   Moderna Covid-19 Vaccine Bivalent Booster 71yrs & up 04/12/2021, 08/17/2021   Moderna SARS-COV2 Booster Vaccination 10/10/2020   Moderna Sars-Covid-2 Vaccination 11/30/2019, 01/07/2020   PNEUMOCOCCAL CONJUGATE-20 09/06/2022   Pneumococcal Conjugate-13 10/30/2016, 12/24/2018   Pneumococcal Polysaccharide-23 11/29/2005    Pneumococcal-Unspecified 11/25/2005   Tdap 10/29/2011, 07/21/2022   Unspecified SARS-COV-2 Vaccination 08/22/2023   Zoster Recombinant(Shingrix) 02/25/2018, 06/15/2018   Zoster, Live 02/23/2006   Pertinent  Health Maintenance Due  Topic Date Due   Influenza Vaccine  06/25/2024   DEXA SCAN  Completed      04/16/2021   12:12 PM 07/21/2022    6:58 PM 07/22/2022    1:12 AM 06/05/2023    2:44 PM 10/20/2023    9:27 PM  Fall Risk  Falls in the past year?    0 1  Was there an injury with Fall?     0  Fall Risk Category Calculator     2  (RETIRED) Patient Fall Risk Level High fall risk  Low fall risk  Low fall risk     Patient at Risk for Falls Due to     History of fall(s)  Fall risk Follow up     Falls evaluation completed     Data saved with a previous flowsheet row definition   Functional Status Survey:    Vitals:   09/07/24 1138  BP: 135/85  Pulse: 69  SpO2: 91%  Weight: 129 lb 6.4 oz (58.7 kg)  Height: 5' 3 (1.6 m)   Body mass index is 22.92 kg/m. Physical Exam  Labs reviewed: Recent Labs    04/26/24 0000 07/12/24 0000 07/29/24 0000 09/06/24 0000  NA 139 134* 133* 136*  K 3.7 3.4* 4.5 4.2  CL 101 91* 99 101  CO2 32* 37* 27* 31*  BUN 27* 30* 20 27*  CREATININE 1.0 1.4* 0.9 1.1  CALCIUM 8.8  --  8.6* 8.4*   Recent Labs    07/12/24 0000  AST 17  ALT 12  ALKPHOS 62  ALBUMIN 3.3*   Recent Labs    04/26/24 0000 05/18/24 0000 07/12/24 0000 07/29/24 0000  WBC 6.4 5.6 7.2 5.7  NEUTROABS 3,878.00  --  4,968.00 3,454.00  HGB 10.7* 11.4* 10.8* 11.2*  HCT 33* 35* 32* 34*  PLT 233 254 274 262   No results found for: TSH No results found for: HGBA1C No results found for: CHOL, HDL, LDLCALC, LDLDIRECT, TRIG, CHOLHDL  Significant Diagnostic Results in last 30 days:  No results found.  Assessment/Plan There are no diagnoses linked to this encounter.   Family/ staff Communication:  Labs/tests ordered:

## 2024-09-08 NOTE — Progress Notes (Signed)
 This encounter was created in error - please disregard.

## 2024-09-09 ENCOUNTER — Encounter: Payer: Self-pay | Admitting: Nurse Practitioner

## 2024-09-09 ENCOUNTER — Non-Acute Institutional Stay (SKILLED_NURSING_FACILITY): Payer: Self-pay | Admitting: Nurse Practitioner

## 2024-09-09 DIAGNOSIS — I351 Nonrheumatic aortic (valve) insufficiency: Secondary | ICD-10-CM

## 2024-09-09 DIAGNOSIS — I10 Essential (primary) hypertension: Secondary | ICD-10-CM | POA: Diagnosis not present

## 2024-09-09 DIAGNOSIS — N3281 Overactive bladder: Secondary | ICD-10-CM | POA: Diagnosis not present

## 2024-09-09 DIAGNOSIS — N1832 Chronic kidney disease, stage 3b: Secondary | ICD-10-CM

## 2024-09-09 DIAGNOSIS — K5904 Chronic idiopathic constipation: Secondary | ICD-10-CM

## 2024-09-09 DIAGNOSIS — R29898 Other symptoms and signs involving the musculoskeletal system: Secondary | ICD-10-CM

## 2024-09-09 NOTE — Progress Notes (Signed)
 Location:  Other [facility].  Nursing Home Room Number: [facility] [id number] Place of Service:  SNF [phone]) Harlene An, NP  PCP: Abdul Fine, MD  Patient Care Team: Abdul Fine, MD as PCP - General (Family Medicine) An Harlene POUR, NP as Nurse Practitioner (Geriatric Medicine)  Extended Emergency Contact Information Primary Emergency Contact: [facility] Address: [address]          [place], [place] [address] [place] Home Phone: [phone] Mobile Phone: [phone] Relation: Son Secondary Emergency Contact: Kluender,[name] Address: [address]          [place], [place] [address] [place] Work Phone: [phone] Mobile Phone: [phone] Relation: Son  Goals of care: Advanced Directive information    05/24/2024    2:02 PM  Advanced Directives  Does Patient Have a Medical Advance Directive? Yes  Type of Advance Directive Out of facility DNR (pink MOST or yellow form)  Does patient want to make changes to medical advance directive? No - Patient declined     Chief Complaint  Patient presents with   Medical Management of Chronic Issues    Medical Management of Chronic Issues.     HPI:  Pt is a 89 y.o. female seen today for medical management of chronic disease. Pt with hx of OAB, anemia, htn, LE edema and AV insuffiencey.  Overall she has been doing well. Reports using her wheelchair more to get around. She still goes to the gym to exercise daily She denies shortness of breath or chest pains. No worsening LE edema She continues on myrbetriq due to overactive bladder- denies worsening of symptoms unless she is given extra lasix.  No issues with constipation or diarrhea.  Reports good appetite.  Denies anxiety or depression.  Past Medical History:  Diagnosis Date   Anemia    Per [facility] Records   Carpal tunnel syndrome    Per [facility] Records   Difficulty walking    Per [facility] Records   Gait disturbance     Hypertension    Low serum vitamin D    Stage 3b chronic kidney disease (HCC)    Tinnitus    Per [facility] Records   Past Surgical History:  Procedure Laterality Date   APPENDECTOMY     BACK SURGERY     CATARACT EXTRACTION, BILATERAL     CORNEAL TRANSPLANT     TOTAL ABDOMINAL HYSTERECTOMY W/ BILATERAL SALPINGOOPHORECTOMY     TUBAL LIGATION      No Known Allergies  Outpatient Encounter Medications as of 09/09/2024  Medication Sig   alendronate (FOSAMAX) 70 MG tablet Take 70 mg by mouth once a week. Take with a full glass of water on an empty stomach. Every Wednesday   amLODipine  (NORVASC ) 5 MG tablet Take 5 mg by mouth daily.   Cholecalciferol (VITAMIN D3) 50 MCG (2000 UT) TABS Take 1 tablet by mouth daily.   cyanocobalamin (VITAMIN B12) 1000 MCG tablet Take 1,000 mcg by mouth daily.   furosemide (LASIX) 40 MG tablet Take 40 mg by mouth daily. (Patient taking differently: Take 40 mg by mouth every other day.)   lisinopril  (ZESTRIL ) 40 MG tablet Take 40 mg by mouth daily.   melatonin 5 MG TABS Take 5 mg by mouth at bedtime.   metoprolol succinate (TOPROL-XL) 25 MG 24 hr tablet Take 1 tablet by mouth in the morning and at bedtime.   mirabegron ER (MYRBETRIQ) 25 MG TB24 tablet Take 25 mg by mouth at  bedtime.   polyethylene glycol (MIRALAX / GLYCOLAX) 17 g packet Take 17 g by mouth every Monday, Wednesday, and Friday. Hold for loose stools   potassium chloride (KLOR-CON) 20 MEQ packet Take 20 mEq by mouth every other day.   Sunscreen SPF50 LOTN Apply 1 Application topically every 2 (two) hours as needed (Sunburn Prevention).   calcium carbonate (OSCAL) 1500 (600 Ca) MG TABS tablet Take 600 mg of elemental calcium by mouth 2 (two) times daily with a meal. (Patient not taking: Reported on 09/09/2024)   No facility-administered encounter medications on file as of 09/09/2024.    Review of Systems  Constitutional:  Negative for activity change, appetite change, fatigue and unexpected  weight change.  HENT:  Negative for congestion and hearing loss.   Eyes: Negative.   Respiratory:  Negative for cough and shortness of breath.   Cardiovascular:  Negative for chest pain, palpitations and leg swelling.  Gastrointestinal:  Negative for abdominal pain, constipation and diarrhea.  Genitourinary:  Negative for difficulty urinating and dysuria.  Musculoskeletal:  Negative for arthralgias and myalgias.  Skin:  Negative for color change and wound.  Neurological:  Negative for dizziness and weakness.  Psychiatric/Behavioral:  Negative for agitation, behavioral problems and confusion.      Immunization History  Administered Date(s) Administered   Fluad Quad(high Dose 65+) 09/17/2023   INFLUENZA, HIGH DOSE SEASONAL PF 09/10/2022   Influenza Split 10/18/2015   Influenza-Unspecified 09/24/2016, 09/07/2024   Moderna Covid-19 Fall Seasonal Vaccine 109yrs & older 03/04/2023   Moderna Covid-19 Vaccine Bivalent Booster 90yrs & up 04/12/2021, 08/17/2021   Moderna SARS-COV2 Booster Vaccination 10/10/2020   Moderna Sars-Covid-2 Vaccination 11/30/2019, 01/07/2020   PNEUMOCOCCAL CONJUGATE-20 09/06/2022   Pneumococcal Conjugate-13 10/30/2016, 12/24/2018   Pneumococcal Polysaccharide-23 11/29/2005   Pneumococcal-Unspecified 11/25/2005   Tdap 10/29/2011, 07/21/2022   Unspecified SARS-COV-2 Vaccination 08/22/2023   Zoster Recombinant(Shingrix) 02/25/2018, 06/15/2018   Zoster, Live 02/23/2006   Pertinent  Health Maintenance Due  Topic Date Due   Influenza Vaccine  Completed   DEXA SCAN  Completed      04/16/2021   12:12 PM 07/21/2022    6:58 PM 07/22/2022    1:12 AM 06/05/2023    2:44 PM 10/20/2023    9:27 PM  Fall Risk  Falls in the past year?    0 1  Was there an injury with Fall?     0  Fall Risk Category Calculator     2  (RETIRED) Patient Fall Risk Level High fall risk  Low fall risk  Low fall risk     Patient at Risk for Falls Due to     History of fall(s)  Fall risk Follow up      Falls evaluation completed     Data saved with a previous flowsheet row definition   Functional Status Survey:    Vitals:   09/09/24 1106  BP: 129/71  Pulse: 64  Resp: 18  Temp: (!) 97.2 F (36.2 C)  SpO2: 93%  Weight: 131 lb 3.2 oz (59.5 kg)  Height: 5' 3 (1.6 m)   Body mass index is 23.24 kg/m. Physical Exam Constitutional:      General: She is not in acute distress.    Appearance: She is well-developed. She is not diaphoretic.  HENT:     Head: Normocephalic and atraumatic.     Mouth/Throat:     Pharynx: No oropharyngeal exudate.  Eyes:     Conjunctiva/sclera: Conjunctivae normal.     Pupils: Pupils are equal,  round, and reactive to light.  Cardiovascular:     Rate and Rhythm: Normal rate and regular rhythm.     Heart sounds: Normal heart sounds.  Pulmonary:     Effort: Pulmonary effort is normal.     Breath sounds: Normal breath sounds.  Abdominal:     General: Bowel sounds are normal.     Palpations: Abdomen is soft.  Musculoskeletal:     Cervical back: Normal range of motion and neck supple.     Right lower leg: No edema.     Left lower leg: No edema.  Skin:    General: Skin is warm and dry.  Neurological:     Mental Status: She is alert.     Motor: Weakness present.     Gait: Gait abnormal.  Psychiatric:        Mood and Affect: Mood normal.     Labs reviewed: Recent Labs    04/26/24 0000 07/12/24 0000 07/29/24 0000 09/06/24 0000  NA 139 134* 133* 136*  K 3.7 3.4* 4.5 4.2  CL 101 91* 99 101  CO2 32* 37* 27* 31*  BUN 27* 30* 20 27*  CREATININE 1.0 1.4* 0.9 1.1  CALCIUM 8.8  --  8.6* 8.4*   Recent Labs    07/12/24 0000  AST 17  ALT 12  ALKPHOS 62  ALBUMIN 3.3*   Recent Labs    04/26/24 0000 05/18/24 0000 07/12/24 0000 07/29/24 0000  WBC 6.4 5.6 7.2 5.7  NEUTROABS 3,878.00  --  4,968.00 3,454.00  HGB 10.7* 11.4* 10.8* 11.2*  HCT 33* 35* 32* 34*  PLT 233 254 274 262   No results found for: TSH No results found for:  HGBA1C No results found for: CHOL, HDL, LDLCALC, LDLDIRECT, TRIG, CHOLHDL  Significant Diagnostic Results in last 30 days:  No results found.  Assessment/Plan Right arm weakness Progressive weakness of right arm and hand. Continues with supportive care and DME  OAB (overactive bladder) Stable, exacerbated by lasix use.  Continues on myrbetriq  Nonrheumatic aortic valve insufficiency Stable, no symptoms at this time. Currently on lasix every other day Echo completed 08/15/2020 showing EF of 60%  Essential hypertension Blood pressure well controlled, goal bp <140/90 Continue current medications and dietary modifications follow metabolic panel  Chronic idiopathic constipation Controlled on miralax  Stage 3b chronic kidney disease (HCC) Chronic and stable Encourage proper hydration Follow metabolic panel Avoid nephrotoxic meds (NSAIDS)     Arael Piccione K. Caro BODILY [facility] [phone]

## 2024-09-15 NOTE — Assessment & Plan Note (Signed)
 Stable, exacerbated by lasix use.  Continues on myrbetriq

## 2024-09-15 NOTE — Assessment & Plan Note (Signed)
 Progressive weakness of right arm and hand. Continues with supportive care and DME

## 2024-09-15 NOTE — Assessment & Plan Note (Signed)
 Stable, no symptoms at this time. Currently on lasix every other day Echo completed 08/15/2020 showing EF of 60%

## 2024-09-15 NOTE — Assessment & Plan Note (Signed)
 Controlled on miralax

## 2024-09-15 NOTE — Assessment & Plan Note (Signed)
 Chronic and stable Encourage proper hydration Follow metabolic panel Avoid nephrotoxic meds (NSAIDS)

## 2024-09-15 NOTE — Assessment & Plan Note (Signed)
 Blood pressure well controlled, goal bp <140/90 Continue current medications and dietary modifications follow metabolic panel

## 2024-10-11 ENCOUNTER — Non-Acute Institutional Stay (SKILLED_NURSING_FACILITY): Payer: Self-pay | Admitting: Orthopedic Surgery

## 2024-10-11 ENCOUNTER — Encounter: Payer: Self-pay | Admitting: Orthopedic Surgery

## 2024-10-11 DIAGNOSIS — I351 Nonrheumatic aortic (valve) insufficiency: Secondary | ICD-10-CM

## 2024-10-11 DIAGNOSIS — M810 Age-related osteoporosis without current pathological fracture: Secondary | ICD-10-CM

## 2024-10-11 DIAGNOSIS — F5101 Primary insomnia: Secondary | ICD-10-CM

## 2024-10-11 DIAGNOSIS — N3281 Overactive bladder: Secondary | ICD-10-CM

## 2024-10-11 DIAGNOSIS — N1831 Chronic kidney disease, stage 3a: Secondary | ICD-10-CM

## 2024-10-11 DIAGNOSIS — I10 Essential (primary) hypertension: Secondary | ICD-10-CM | POA: Diagnosis not present

## 2024-10-11 NOTE — Progress Notes (Signed)
 Location:  Other [facility]) Nursing Home Room Number: 205 A Place of Service:  SNF (31) Provider:  Greig CHARLENA Cluster, NP   Patient Care Team: Laurence Locus, DO as PCP - General (Internal Medicine) Caro Harlene POUR, NP as Nurse Practitioner (Geriatric Medicine)  Extended Emergency Contact Information Primary Emergency Contact: [facility] Address: [address]          [place], [place] [address] [place] Home Phone: [phone] Mobile Phone: [phone] Relation: Son Secondary Emergency Contact: Banwart,[name] Address: [address]          [place], [place] [address] [place] Work Phone: [phone] Mobile Phone: [phone] Relation: Son  Code Status:  DNR Goals of care: Advanced Directive information    05/24/2024    2:02 PM  Advanced Directives  Does Patient Have a Medical Advance Directive? Yes  Type of Advance Directive Out of facility DNR (pink MOST or yellow form)  Does patient want to make changes to medical advance directive? No - Patient declined     Chief Complaint  Patient presents with   Medical Management of Chronic Issues    Routine visit.     HPI:  Pt is a 89 y.o. female seen today for medical management of chronic diseases.    She currently resides on the skilled nursing unit at [facility]. PMH: CHF, HTN, aortic insufficiency, constipation, osteoporosis, OAB, CKD, anemia, incontinence and unable gait.   Aortic insufficiency- BUN/creat 27/1.1, K+ 4.2 09/06/2024, past BNP > 700, LVEF 60% 2021, asymptomatic, remains on furosemide/KCL HTN- see trends below, remains on amlodipine , metoprolol and lisinopril   CKD- GFR 50 (10/13)> was 58 (09/04) OAB- remains on myrbetriq Osteoporosis- remains on Fosamax and vitamin D Insomnia- remains on melatonin  No concerns today. Ambulates with walker. No recent falls.   11/06 word finding more. ST consulted.   Recent weights:  11/17- 131.6 lbs  11/03- 131 lbs  10/15- 131.2 lbs  10/01-  130 lbs  Recent blood pressures:  11/12- 121/79, 130/61  11/05- 154/74, 148/81  Past Medical History:  Diagnosis Date   Anemia    Per [facility] Records   Carpal tunnel syndrome    Per [facility] Records   Difficulty walking    Per [facility] Records   Gait disturbance    Hypertension    Low serum vitamin D    Stage 3b chronic kidney disease (HCC)    Tinnitus    Per [facility] Records   Past Surgical History:  Procedure Laterality Date   APPENDECTOMY     BACK SURGERY     CATARACT EXTRACTION, BILATERAL     CORNEAL TRANSPLANT     TOTAL ABDOMINAL HYSTERECTOMY W/ BILATERAL SALPINGOOPHORECTOMY     TUBAL LIGATION      No Known Allergies  Outpatient Encounter Medications as of 10/11/2024  Medication Sig   alendronate (FOSAMAX) 70 MG tablet Take 70 mg by mouth once a week. Take with a full glass of water on an empty stomach. Every Wednesday   amLODipine  (NORVASC ) 5 MG tablet Take 5 mg by mouth daily.   Cholecalciferol (VITAMIN D3) 50 MCG (2000 UT) TABS Take 1 tablet by mouth daily.   cyanocobalamin (VITAMIN B12) 1000 MCG tablet Take 1,000 mcg by mouth daily.   furosemide (LASIX) 40 MG tablet Take 40 mg by mouth every other day.   lisinopril  (ZESTRIL ) 40 MG tablet Take 40 mg by mouth daily.   melatonin 5 MG TABS Take 5 mg by mouth  at bedtime.   metoprolol succinate (TOPROL-XL) 25 MG 24 hr tablet Take 1 tablet by mouth in the morning and at bedtime.   mirabegron ER (MYRBETRIQ) 25 MG TB24 tablet Take 25 mg by mouth at bedtime.   polyethylene glycol (MIRALAX / GLYCOLAX) 17 g packet Take 17 g by mouth every Monday, Wednesday, and Friday. Hold for loose stools   potassium chloride (KLOR-CON) 20 MEQ packet Take 20 mEq by mouth every other day.   Sunscreen SPF50 LOTN Apply 1 Application topically every 2 (two) hours as needed (Sunburn Prevention).   No facility-administered encounter medications on file as of 10/11/2024.    Review of Systems  Constitutional:  Negative for fatigue  and fever.  HENT:  Negative for sore throat and trouble swallowing.   Eyes:  Negative for visual disturbance.  Respiratory:  Negative for cough and shortness of breath.   Cardiovascular:  Negative for chest pain and leg swelling.  Gastrointestinal:  Negative for abdominal distention and abdominal pain.  Genitourinary:  Negative for dysuria, hematuria and vaginal bleeding.  Musculoskeletal:  Positive for gait problem.  Skin:  Negative for wound.  Neurological:  Positive for weakness. Negative for dizziness and headaches.  Psychiatric/Behavioral:  Positive for sleep disturbance. Negative for confusion and dysphoric mood. The patient is not nervous/anxious.     Immunization History  Administered Date(s) Administered   Fluad Quad(high Dose 65+) 09/17/2023   INFLUENZA, HIGH DOSE SEASONAL PF 09/10/2022   Influenza Split 10/18/2015   Influenza-Unspecified 09/24/2016, 09/07/2024   Moderna Covid-19 Fall Seasonal Vaccine 89yrs & older 03/04/2023   Moderna Covid-19 Vaccine Bivalent Booster 75yrs & up 04/12/2021, 08/17/2021   Moderna SARS-COV2 Booster Vaccination 10/10/2020   Moderna Sars-Covid-2 Vaccination 11/30/2019, 01/07/2020   PNEUMOCOCCAL CONJUGATE-20 09/06/2022   Pneumococcal Conjugate-13 10/30/2016, 12/24/2018   Pneumococcal Polysaccharide-23 11/29/2005   Pneumococcal-Unspecified 11/25/2005   Tdap 10/29/2011, 07/21/2022   Unspecified SARS-COV-2 Vaccination 08/22/2023   Zoster Recombinant(Shingrix) 02/25/2018, 06/15/2018   Zoster, Live 02/23/2006   Pertinent  Health Maintenance Due  Topic Date Due   Influenza Vaccine  Completed   DEXA SCAN  Completed      04/16/2021   12:12 PM 07/21/2022    6:58 PM 07/22/2022    1:12 AM 06/05/2023    2:44 PM 10/20/2023    9:27 PM  Fall Risk  Falls in the past year?    0 1  Was there an injury with Fall?     0  Fall Risk Category Calculator     2  (RETIRED) Patient Fall Risk Level High fall risk  Low fall risk  Low fall risk     Patient at  Risk for Falls Due to     History of fall(s)  Fall risk Follow up     Falls evaluation completed     Data saved with a previous flowsheet row definition   Functional Status Survey:    Vitals:   10/11/24 0947  BP: 121/79  Pulse: 84  Weight: 132 lb (59.9 kg)  Height: 5' 3 (1.6 m)   Body mass index is 23.38 kg/m. Physical Exam Vitals reviewed.  Constitutional:      General: She is not in acute distress. HENT:     Head: Normocephalic.  Eyes:     General:        Right eye: No discharge.        Left eye: No discharge.  Cardiovascular:     Rate and Rhythm: Normal rate and regular rhythm.  Pulses: Normal pulses.     Heart sounds: Normal heart sounds.  Pulmonary:     Effort: Pulmonary effort is normal. No respiratory distress.     Breath sounds: Normal breath sounds. No wheezing.  Abdominal:     General: Bowel sounds are normal. There is no distension.     Palpations: Abdomen is soft.     Tenderness: There is no abdominal tenderness.  Musculoskeletal:     Cervical back: Neck supple.     Right lower leg: No edema.     Left lower leg: No edema.  Skin:    General: Skin is warm.     Capillary Refill: Capillary refill takes less than 2 seconds.  Neurological:     General: No focal deficit present.     Mental Status: She is alert and oriented to person, place, and time.     Motor: Weakness present.     Gait: Gait abnormal.  Psychiatric:        Mood and Affect: Mood normal.     Comments: Mild word finding during encounter     Labs reviewed: Recent Labs    04/26/24 0000 07/12/24 0000 07/29/24 0000 09/06/24 0000  NA 139 134* 133* 136*  K 3.7 3.4* 4.5 4.2  CL 101 91* 99 101  CO2 32* 37* 27* 31*  BUN 27* 30* 20 27*  CREATININE 1.0 1.4* 0.9 1.1  CALCIUM 8.8  --  8.6* 8.4*   Recent Labs    07/12/24 0000  AST 17  ALT 12  ALKPHOS 62  ALBUMIN 3.3*   Recent Labs    04/26/24 0000 05/18/24 0000 07/12/24 0000 07/29/24 0000  WBC 6.4 5.6 7.2 5.7  NEUTROABS  3,878.00  --  4,968.00 3,454.00  HGB 10.7* 11.4* 10.8* 11.2*  HCT 33* 35* 32* 34*  PLT 233 254 274 262   No results found for: TSH No results found for: HGBA1C No results found for: CHOL, HDL, LDLCALC, LDLDIRECT, TRIG, CHOLHDL  Significant Diagnostic Results in last 30 days:  No results found.  Assessment/Plan 1. Nonrheumatic aortic valve insufficiency (Primary) - asymptomatic  - cont Furosemide/KCL  2. Essential hypertension - controlled with amlodipine , metoprolol and lisinopril   3. Stage 3a chronic kidney disease (HCC) - encourage hydration with water - avoid NSAIDS  4. OAB (overactive bladder) - stable with Myrbetriq  5. Age-related osteoporosis without current pathological fracture - no recent DEXA - cont Fosamax and vitamin D  6. Primary insomnia - cont melatonin     Family/ staff Communication: plan discussed with patient and nurse  Labs/tests ordered:  none

## 2024-11-11 ENCOUNTER — Non-Acute Institutional Stay: Payer: Self-pay | Admitting: Internal Medicine

## 2024-11-11 DIAGNOSIS — R262 Difficulty in walking, not elsewhere classified: Secondary | ICD-10-CM | POA: Diagnosis not present

## 2024-11-11 DIAGNOSIS — N1832 Chronic kidney disease, stage 3b: Secondary | ICD-10-CM

## 2024-11-11 DIAGNOSIS — I5032 Chronic diastolic (congestive) heart failure: Secondary | ICD-10-CM

## 2024-11-11 DIAGNOSIS — M810 Age-related osteoporosis without current pathological fracture: Secondary | ICD-10-CM

## 2024-11-11 DIAGNOSIS — I10 Essential (primary) hypertension: Secondary | ICD-10-CM | POA: Diagnosis not present

## 2024-11-11 DIAGNOSIS — N3281 Overactive bladder: Secondary | ICD-10-CM | POA: Diagnosis not present

## 2024-11-11 DIAGNOSIS — K5904 Chronic idiopathic constipation: Secondary | ICD-10-CM

## 2024-11-11 LAB — COMPREHENSIVE METABOLIC PANEL WITH GFR
Albumin: 3.4 — AB (ref 3.5–5.0)
Calcium: 8.5 — AB (ref 8.7–10.7)
Globulin: 2.2
eGFR: 61

## 2024-11-11 LAB — BASIC METABOLIC PANEL WITH GFR
BUN: 25 — AB (ref 4–21)
CO2: 29 — AB (ref 13–22)
Chloride: 99 (ref 99–108)
Creatinine: 0.9 (ref 0.5–1.1)
Glucose: 72
Potassium: 4.2 meq/L (ref 3.5–5.1)
Sodium: 137 (ref 137–147)

## 2024-11-11 LAB — HEPATIC FUNCTION PANEL
ALT: 10 U/L (ref 7–35)
AST: 17 (ref 13–35)
Alkaline Phosphatase: 56 (ref 25–125)
Bilirubin, Total: 0.4

## 2024-11-11 NOTE — Progress Notes (Signed)
 [facility] SNF Routine Visit Progress Note    Location:  Other [facility]) Nursing Home Room Number: [place] 205-A Place of Service:  SNF (31) [facility])   Laurence Locus, DO   Patient Care Team: Laurence Locus, DO as PCP - General (Internal Medicine) Caro Harlene POUR, NP as Nurse Practitioner (Geriatric Medicine)   Extended Emergency Contact Information Primary Emergency Contact: [facility] Address: [address]          [place], [place] [address] [place] Home Phone: [phone] Mobile Phone: [phone] Relation: Son Secondary Emergency Contact: Werling,[name] Address: [address]          [place], [place] [address] [place] Work Phone: [phone] Mobile Phone: [phone] Relation: Son   Goals of care: Advanced Directive information    11/11/2024    3:34 PM  Advanced Directives  Does Patient Have a Medical Advance Directive? Yes  Type of Advance Directive Out of facility DNR (pink MOST or yellow form)  Does patient want to make changes to medical advance directive? No - Patient declined  Would patient like information on creating a medical advance directive? No - Patient declined    CODE STATUS: Do Not Resuscitate (DNR)   Chief Complaint  Patient presents with   Medical Management of Chronic Issues    Routine Visit, needs covid vaccine     HPI: Pt is a 89 y.o. female seen today for medical management of chronic disease.   Mr. Getty is a 89 year old female with a history of hypertension, stage IIIb CKD, chronic diastolic heart failure, overactive bladder who is seen for her monthly routine visit.  Since her last visit on October 11, 2024, she has done well.  She has no complaints about her health.  She was married.  She had 4 children.  She has about 11 grandchildren.  She has grandkids that live in [place].  She has a granddaughter that lives in [place].  She used to love travel.  She has been to [place], [place], [place].  She has  no health concerns.  Her family is going to come see her for her birthday in January.   Past Medical History:  Diagnosis Date   Anemia    Per [facility] Records   Auditory hallucinations 08/23/2020   Carpal tunnel syndrome    Per [facility] Records   Difficulty walking    Per [facility] Records   Gait disturbance    Hypertension    Hyponatremia 04/12/2022   Low serum vitamin D    Stage 3b chronic kidney disease (HCC)    Tinnitus    Per [facility] Records   Past Surgical History:  Procedure Laterality Date   APPENDECTOMY     BACK SURGERY     CATARACT EXTRACTION, BILATERAL     CORNEAL TRANSPLANT     TOTAL ABDOMINAL HYSTERECTOMY W/ BILATERAL SALPINGOOPHORECTOMY     TUBAL LIGATION       Allergies[1]   Outpatient Encounter Medications as of 11/11/2024  Medication Sig   alendronate (FOSAMAX) 70 MG tablet Take 70 mg by mouth once a week. Take with a full glass of water on an empty stomach. Every Wednesday   amLODipine  (NORVASC ) 5 MG tablet Take 5 mg by mouth daily.   Cholecalciferol (VITAMIN D3) 50 MCG (2000 UT) TABS Take 1 tablet by mouth daily.   cyanocobalamin (VITAMIN B12) 1000 MCG tablet Take 1,000 mcg by mouth daily.   furosemide (LASIX) 40 MG tablet Take 40 mg  by mouth every other day.   lisinopril  (ZESTRIL ) 40 MG tablet Take 40 mg by mouth daily.   melatonin 5 MG TABS Take 5 mg by mouth at bedtime.   metoprolol succinate (TOPROL-XL) 25 MG 24 hr tablet Take 1 tablet by mouth in the morning and at bedtime.   mirabegron ER (MYRBETRIQ) 25 MG TB24 tablet Take 25 mg by mouth at bedtime.   polyethylene glycol (MIRALAX / GLYCOLAX) 17 g packet Take 17 g by mouth every Monday, Wednesday, and Friday. Hold for loose stools   potassium chloride (KLOR-CON) 20 MEQ packet Take 20 mEq by mouth every other day.   Sunscreen SPF50 LOTN Apply 1 Application topically every 2 (two) hours as needed (Sunburn Prevention).   No facility-administered encounter medications on file as of 11/11/2024.      Review of Systems  Constitutional: Negative.   HENT: Negative.    Eyes: Negative.   Respiratory: Negative.    Cardiovascular: Negative.   Gastrointestinal: Negative.   Endocrine: Negative.   Genitourinary: Negative.   Musculoskeletal: Negative.   Allergic/Immunologic: Negative.   Neurological: Negative.   Hematological: Negative.   Psychiatric/Behavioral: Negative.    All other systems reviewed and are negative.     Immunization History  Administered Date(s) Administered   Fluad Quad(high Dose 65+) 09/17/2023   INFLUENZA, HIGH DOSE SEASONAL PF 09/10/2022   Influenza Split 10/18/2015   Influenza-Unspecified 09/24/2016, 09/07/2024   Moderna Covid-19 Fall Seasonal Vaccine 71yrs & older 03/04/2023   Moderna Covid-19 Vaccine Bivalent Booster 50yrs & up 04/12/2021, 08/17/2021   Moderna SARS-COV2 Booster Vaccination 10/10/2020   Moderna Sars-Covid-2 Vaccination 11/30/2019, 01/07/2020   PNEUMOCOCCAL CONJUGATE-20 09/06/2022   Pneumococcal Conjugate-13 10/30/2016, 12/24/2018   Pneumococcal Polysaccharide-23 11/29/2005   Pneumococcal-Unspecified 11/25/2005   Tdap 10/29/2011, 07/21/2022   Unspecified SARS-COV-2 Vaccination 08/22/2023   Zoster Recombinant(Shingrix) 02/25/2018, 06/15/2018   Zoster, Live 02/23/2006   Pertinent  Health Maintenance Due  Topic Date Due   Influenza Vaccine  Completed   Bone Density Scan  Completed      07/21/2022    6:58 PM 07/22/2022    1:12 AM 06/05/2023    2:44 PM 10/20/2023    9:27 PM 10/11/2024    7:08 PM  Fall Risk  Falls in the past year?   0 1 1  Was there an injury with Fall?    0  0   Fall Risk Category Calculator    2 1  (RETIRED) Patient Fall Risk Level Low fall risk  Low fall risk      Patient at Risk for Falls Due to    History of fall(s) History of fall(s);Impaired balance/gait  Fall risk Follow up    Falls evaluation completed Falls evaluation completed     Data saved with a previous flowsheet row definition   Functional  Status Survey:     Vitals:   11/11/24 1526  BP: 132/72  Pulse: 60  Resp: 18  Temp: 98.1 F (36.7 C)  SpO2: 91%  Weight: 136 lb 9.6 oz (62 kg)  Height: 5' 3 (1.6 m)   Body mass index is 24.2 kg/m. Physical Exam Vitals and nursing note reviewed.  Constitutional:      General: She is not in acute distress.    Appearance: She is not toxic-appearing or diaphoretic.  HENT:     Head: Normocephalic and atraumatic.     Nose: Nose normal.  Cardiovascular:     Rate and Rhythm: Normal rate and regular rhythm.  Pulmonary:  Effort: Pulmonary effort is normal.     Breath sounds: Normal breath sounds.  Abdominal:     General: Bowel sounds are normal. There is no distension.     Palpations: Abdomen is soft.  Skin:    General: Skin is warm and dry.     Capillary Refill: Capillary refill takes less than 2 seconds.  Neurological:     Mental Status: She is alert. She is disoriented.     Comments: Mild dysarthria      Labs reviewed: Recent Labs    04/26/24 0000 07/12/24 0000 07/29/24 0000 09/06/24 0000  NA 139 134* 133* 136*  K 3.7 3.4* 4.5 4.2  CL 101 91* 99 101  CO2 32* 37* 27* 31*  BUN 27* 30* 20 27*  CREATININE 1.0 1.4* 0.9 1.1  CALCIUM 8.8  --  8.6* 8.4*   Recent Labs    07/12/24 0000  AST 17  ALT 12  ALKPHOS 62  ALBUMIN 3.3*   Recent Labs    04/26/24 0000 05/18/24 0000 07/12/24 0000 07/29/24 0000  WBC 6.4 5.6 7.2 5.7  NEUTROABS 3,878.00  --  4,968.00 3,454.00  HGB 10.7* 11.4* 10.8* 11.2*  HCT 33* 35* 32* 34*  PLT 233 254 274 262   No results found for: TSH No results found for: HGBA1C No results found for: CHOL, HDL, LDLCALC, LDLDIRECT, TRIG, CHOLHDL   Significant Diagnostic Results in last 30 days: No results found.   Assessment/Plan Chronic heart failure with preserved ejection fraction (HCC) Continue with Lasix 40 mg every other day, as needed 20 mg as needed for weight gain greater than 3 pounds, lisinopril  40 mg daily,  Toprol-XL 25 mg daily.  She is not a candidate for Farxiga due to CKD stage IIIb and concern for urinary tract infections.  Essential hypertension Continue with Lasix 40 mg every other day, lisinopril  40 mg daily, Toprol-XL 25 mg daily, Norvasc  5 mg daily  Stage 3b chronic kidney disease (HCC) Baseline serum creatinine between 1 and 1.2.  Most recent chemistry from October 13,025 showed a BUN of 27, creatinine 1.05.  Estimated GFR was 50  OAB (overactive bladder) Stable on Myrbetriq XR 25 mg nightly  Difficulty walking Patient continues to work with physical therapy.  She has a rollator.  Chronic idiopathic constipation Patient continues on MiraLAX 17 g at bedtime every Monday Wednesday Friday  Age-related osteoporosis without current pathological fracture Patient continues on Fosamax 70 mg q. Wednesday    Labs Ordered: she has CMP and Ionized calcium ordered for today.   Camellia Door, DO [facility] [phone]      [1] No Known Allergies

## 2024-11-11 NOTE — Assessment & Plan Note (Signed)
 Continue with Lasix 40 mg every other day, lisinopril  40 mg daily, Toprol-XL 25 mg daily, Norvasc  5 mg daily

## 2024-11-11 NOTE — Assessment & Plan Note (Signed)
 Patient continues on Fosamax 70 mg q. Wednesday

## 2024-11-11 NOTE — Assessment & Plan Note (Signed)
 Continue with Lasix 40 mg every other day, as needed 20 mg as needed for weight gain greater than 3 pounds, lisinopril  40 mg daily, Toprol-XL 25 mg daily.  She is not a candidate for Farxiga due to CKD stage IIIb and concern for urinary tract infections.

## 2024-11-11 NOTE — Assessment & Plan Note (Signed)
 Stable on Myrbetriq XR 25 mg nightly

## 2024-11-11 NOTE — Assessment & Plan Note (Signed)
 Patient continues to work with physical therapy.  She has a rollator.

## 2024-11-11 NOTE — Assessment & Plan Note (Signed)
 Baseline serum creatinine between 1 and 1.2.  Most recent chemistry from October 13,025 showed a BUN of 27, creatinine 1.05.  Estimated GFR was 50

## 2024-11-11 NOTE — Assessment & Plan Note (Signed)
 Patient continues on MiraLAX 17 g at bedtime every Monday Wednesday Friday

## 2024-12-06 ENCOUNTER — Non-Acute Institutional Stay (SKILLED_NURSING_FACILITY): Payer: Self-pay | Admitting: Orthopedic Surgery

## 2024-12-06 ENCOUNTER — Encounter: Payer: Self-pay | Admitting: Orthopedic Surgery

## 2024-12-06 DIAGNOSIS — M810 Age-related osteoporosis without current pathological fracture: Secondary | ICD-10-CM | POA: Diagnosis not present

## 2024-12-06 DIAGNOSIS — T148XXA Other injury of unspecified body region, initial encounter: Secondary | ICD-10-CM | POA: Diagnosis not present

## 2024-12-06 DIAGNOSIS — F5101 Primary insomnia: Secondary | ICD-10-CM | POA: Diagnosis not present

## 2024-12-06 DIAGNOSIS — N3281 Overactive bladder: Secondary | ICD-10-CM

## 2024-12-06 DIAGNOSIS — J029 Acute pharyngitis, unspecified: Secondary | ICD-10-CM

## 2024-12-06 DIAGNOSIS — I10 Essential (primary) hypertension: Secondary | ICD-10-CM | POA: Diagnosis not present

## 2024-12-06 DIAGNOSIS — I5032 Chronic diastolic (congestive) heart failure: Secondary | ICD-10-CM

## 2024-12-06 DIAGNOSIS — N1831 Chronic kidney disease, stage 3a: Secondary | ICD-10-CM

## 2024-12-06 NOTE — Progress Notes (Unsigned)
 " Location:  Other [facility]  Nursing Home Room Number: [facility] [id number] Place of Service:  SNF [phone]) Provider:  Greig Cluster, NP  Laurence Locus, DO  Patient Care Team: Laurence Locus, DO as PCP - General (Internal Medicine) Caro Harlene POUR, NP as Nurse Practitioner (Geriatric Medicine)  Extended Emergency Contact Information Primary Emergency Contact: [facility] Address: [address]          [place], [place] [address] [place] Home Phone: [phone] Mobile Phone: [phone] Relation: Son Secondary Emergency Contact: Garofano,[name] Address: [address]          [place], [place] [address] [place] Work Phone: [phone] Mobile Phone: [phone] Relation: Son  Code Status:  DNR Goals of care: Advanced Directive information    11/11/2024    3:34 PM  Advanced Directives  Does Patient Have a Medical Advance Directive? Yes  Type of Advance Directive Out of facility DNR (pink MOST or yellow form)  Does patient want to make changes to medical advance directive? No - Patient declined  Would patient like information on creating a medical advance directive? No - Patient declined     Chief Complaint  Patient presents with   Medical Management of Chronic Issues    Medical Management of Chronic Issues.     HPI:  Pt is a 89 y.o. female seen today for medical management of chronic diseases.    She currently resides on the skilled nursing unit at [facility]. PMH: CHF, HTN, aortic insufficiency, constipation, osteoporosis, OAB, CKD, anemia, incontinence and unable gait.    Sore throat- She woke up this morning with sore throat, able to swallow fluids and foods without difficulty, denies cold symptoms, reports sleeping with mouth open at times  Skin tear- noted to right wrist, right forearm and left lower leg, assessed by wound care nurse, she cannot recall how she obtained injuries  CHF- BUN/creat 25/0.9 11/11/2024, past BNP > 700, EF 60% 2021,  asymptomatic, remains on furosemide/KCL HTN- see trends below, remains on amlodipine , metoprolol and lisinopril   CKD- GFR 50 (10/13)> was 58 (09/04) OAB- remains on myrbetriq Osteoporosis- remains on Fosamax and vitamin D Insomnia- remains on melatonin  No recent falls. Ambulates with wheelchair/rolator. Followed by PT.   Recent weights:  01/12- 136 lbs  12/01- 132.6 lbs  11/03- 133 lbs  Recent blood pressures:  01/07- 140/75  12/31- 143/75, 136/78   Past Medical History:  Diagnosis Date   Anemia    Per [facility] Records   Auditory hallucinations 08/23/2020   Carpal tunnel syndrome    Per [facility] Records   Difficulty walking    Per [facility] Records   Gait disturbance    Hypertension    Hyponatremia 04/12/2022   Low serum vitamin D    Stage 3b chronic kidney disease (HCC)    Tinnitus    Per [facility] Records   Past Surgical History:  Procedure Laterality Date   APPENDECTOMY     BACK SURGERY     CATARACT EXTRACTION, BILATERAL     CORNEAL TRANSPLANT     TOTAL ABDOMINAL HYSTERECTOMY W/ BILATERAL SALPINGOOPHORECTOMY     TUBAL LIGATION      Allergies[1]  Outpatient Encounter Medications as of 12/06/2024  Medication Sig   alendronate (FOSAMAX) 70 MG tablet Take 70 mg by mouth once a week. Take with a full glass of water on an empty stomach. Every Wednesday   amLODipine  (NORVASC ) 5 MG tablet Take 5 mg by mouth  daily.   Cholecalciferol (VITAMIN D3) 50 MCG (2000 UT) TABS Take 1 tablet by mouth daily.   cyanocobalamin (VITAMIN B12) 1000 MCG tablet Take 1,000 mcg by mouth daily.   furosemide (LASIX) 20 MG tablet Take 20 mg by mouth. Every 24 hours as needed for weight gain >3lbs in one day or 5lbs in one week.   furosemide (LASIX) 40 MG tablet Take 40 mg by mouth every other day.   lisinopril  (ZESTRIL ) 40 MG tablet Take 40 mg by mouth daily.   melatonin 5 MG TABS Take 5 mg by mouth at bedtime.   metoprolol succinate (TOPROL-XL) 25 MG 24 hr tablet Take 1 tablet by  mouth in the morning and at bedtime.   mirabegron ER (MYRBETRIQ) 25 MG TB24 tablet Take 25 mg by mouth at bedtime.   polyethylene glycol (MIRALAX / GLYCOLAX) 17 g packet Take 17 g by mouth every Monday, Wednesday, and Friday. Hold for loose stools   potassium chloride (KLOR-CON) 20 MEQ packet Take 20 mEq by mouth every other day.   Sunscreen SPF50 LOTN Apply 1 Application topically every 2 (two) hours as needed (Sunburn Prevention).   No facility-administered encounter medications on file as of 12/06/2024.    Review of Systems  Constitutional:  Negative for fatigue and fever.  HENT:  Positive for sore throat. Negative for congestion, ear pain and trouble swallowing.   Eyes:  Negative for visual disturbance.  Respiratory:  Negative for cough, shortness of breath and wheezing.   Cardiovascular:  Positive for leg swelling. Negative for chest pain.  Gastrointestinal:  Positive for constipation. Negative for abdominal distention, abdominal pain, diarrhea, nausea and vomiting.  Genitourinary:  Negative for dysuria and hematuria.  Musculoskeletal:  Positive for gait problem.  Skin:  Positive for wound.  Neurological:  Positive for weakness. Negative for dizziness and headaches.  Psychiatric/Behavioral:  Negative for confusion, dysphoric mood and sleep disturbance. The patient is not nervous/anxious.     Immunization History  Administered Date(s) Administered   Fluad Quad(high Dose 65+) 09/17/2023   INFLUENZA, HIGH DOSE SEASONAL PF 09/10/2022   Influenza Split 10/18/2015   Influenza-Unspecified 09/24/2016, 09/07/2024   Moderna Covid-19 Fall Seasonal Vaccine 71yrs & older 03/04/2023   Moderna Covid-19 Vaccine Bivalent Booster 79yrs & up 04/12/2021, 08/17/2021   Moderna SARS-COV2 Booster Vaccination 10/10/2020   Moderna Sars-Covid-2 Vaccination 11/30/2019, 01/07/2020   PNEUMOCOCCAL CONJUGATE-20 09/06/2022   Pneumococcal Conjugate-13 10/30/2016, 12/24/2018   Pneumococcal Polysaccharide-23  11/29/2005   Pneumococcal-Unspecified 11/25/2005   Tdap 10/29/2011, 07/21/2022   Unspecified SARS-COV-2 Vaccination 08/22/2023, 09/17/2024   Zoster Recombinant(Shingrix) 02/25/2018, 06/15/2018   Zoster, Live 02/23/2006   Pertinent  Health Maintenance Due  Topic Date Due   Influenza Vaccine  Completed   Bone Density Scan  Completed      07/21/2022    6:58 PM 07/22/2022    1:12 AM 06/05/2023    2:44 PM 10/20/2023    9:27 PM 10/11/2024    7:08 PM  Fall Risk  Falls in the past year?   0 1 1  Was there an injury with Fall?    0  0   Fall Risk Category Calculator    2 1  (RETIRED) Patient Fall Risk Level Low fall risk  Low fall risk      Patient at Risk for Falls Due to    History of fall(s) History of fall(s);Impaired balance/gait  Fall risk Follow up    Falls evaluation completed Falls evaluation completed     Data saved with  a previous flowsheet row definition   Functional Status Survey:    Vitals:   12/06/24 1346  BP: (!) 140/75  Pulse: 65  Resp: 18  Temp: (!) 97.1 F (36.2 C)  SpO2: 93%  Weight: 136 lb 3.2 oz (61.8 kg)  Height: 5' 3 (1.6 m)   Body mass index is 24.13 kg/m. Physical Exam Vitals reviewed.  Constitutional:      General: She is not in acute distress. HENT:     Head: Normocephalic and atraumatic.     Right Ear: Tympanic membrane normal.     Left Ear: Tympanic membrane normal.     Nose: Nose normal.     Mouth/Throat:     Mouth: Mucous membranes are moist.     Pharynx: No oropharyngeal exudate or posterior oropharyngeal erythema.  Eyes:     General:        Right eye: No discharge.        Left eye: No discharge.  Cardiovascular:     Rate and Rhythm: Normal rate and regular rhythm.     Pulses: Normal pulses.     Heart sounds: Normal heart sounds.  Pulmonary:     Effort: Pulmonary effort is normal. No respiratory distress.     Breath sounds: Normal breath sounds. No wheezing or rales.  Abdominal:     General: Bowel sounds are normal. There is  no distension.     Palpations: Abdomen is soft.     Tenderness: There is no abdominal tenderness.  Musculoskeletal:     Cervical back: Neck supple.     Right lower leg: Edema present.     Left lower leg: Edema present.     Comments: BLE non pitting   Lymphadenopathy:     Cervical: No cervical adenopathy.  Skin:    General: Skin is warm.     Capillary Refill: Capillary refill takes less than 2 seconds.     Comments: Right wrist/forearm skin tear CDI, LLE skin tear CDI  Neurological:     General: No focal deficit present.     Mental Status: She is alert. Mental status is at baseline.     Gait: Gait abnormal.  Psychiatric:        Mood and Affect: Mood normal.     Labs reviewed: Recent Labs    07/29/24 0000 09/06/24 0000 11/11/24 0000  NA 133* 136* 137  K 4.5 4.2 4.2  CL 99 101 99  CO2 27* 31* 29*  BUN 20 27* 25*  CREATININE 0.9 1.1 0.9  CALCIUM 8.6* 8.4* 8.5*   Recent Labs    07/12/24 0000 11/11/24 0000  AST 17 17  ALT 12 10  ALKPHOS 62 56  ALBUMIN 3.3* 3.4*   Recent Labs    04/26/24 0000 05/18/24 0000 07/12/24 0000 07/29/24 0000  WBC 6.4 5.6 7.2 5.7  NEUTROABS 3,878.00  --  4,968.00 3,454.00  HGB 10.7* 11.4* 10.8* 11.2*  HCT 33* 35* 32* 34*  PLT 233 254 274 262   No results found for: TSH No results found for: HGBA1C No results found for: CHOL, HDL, LDLCALC, LDLDIRECT, TRIG, CHOLHDL  Significant Diagnostic Results in last 30 days:  No results found.  Assessment/Plan 1. Sore throat (Primary) - onset x 1 day - exam unremarkable - able to swallow foods/fluids  - no other symptoms - admits to sleeping with mouth open - discussed warm fluids  - consider covid/flu if symptoms worsen  - consider chloraseptic spray/lozenges if symptoms do not subside  2. Multiple skin tears - no infection  - followed by in house wound nurse  3. Chronic heart failure with preserved ejection fraction (HCC) - EF 60% - lung sounds clear, non pitting  edema, mild weight gain - cont furosemide/KCL   4. Essential hypertension - controlled, goal < 150/90 - cont amlodipine  and lisinopril    5. Stage 3a chronic kidney disease (HCC) - on diuretics - encourage water - avoid NSAIDS  6. Age-related osteoporosis without current pathological fracture - cont Fosamax and vitamin D  7. Primary insomnia - cont melatonin  8. OAB (overactive bladder) - cont Myrbetriq   Family/ staff Communication: plan discussed with patient and nurse  Labs/tests ordered:  none        [1] No Known Allergies  "

## 2024-12-31 ENCOUNTER — Non-Acute Institutional Stay: Payer: Self-pay | Admitting: Orthopedic Surgery

## 2024-12-31 ENCOUNTER — Encounter: Payer: Self-pay | Admitting: Orthopedic Surgery

## 2024-12-31 NOTE — Progress Notes (Unsigned)
 " Location:  Other [facility]) Nursing Home Room Number: 205 A Place of Service:  SNF (31) ([place]) Provider:  Greig CHARLENA Cluster, NP   Patient Care Team: Laurence Locus, DO as PCP - General (Internal Medicine) Caro Harlene POUR, NP as Nurse Practitioner (Geriatric Medicine)  Extended Emergency Contact Information Primary Emergency Contact: [facility] Address: [address]          [place], [place] [address] [place] Home Phone: [phone] Mobile Phone: [phone] Relation: Son Secondary Emergency Contact: Boggess,[name] Address: [address]          [place], [place] [address] [place] Work Phone: [phone] Mobile Phone: [phone] Relation: Son  Code Status:  DNR Goals of care: Advanced Directive information    11/11/2024    3:34 PM  Advanced Directives  Does Patient Have a Medical Advance Directive? Yes  Type of Advance Directive Out of facility DNR (pink MOST or yellow form)  Does patient want to make changes to medical advance directive? No - Patient declined  Would patient like information on creating a medical advance directive? No - Patient declined     Chief Complaint  Patient presents with   Medical Management of Chronic Issues    Routine visit     HPI:  Pt is a 89 y.o. female seen today for medical management of chronic diseases.     Past Medical History:  Diagnosis Date   Anemia    Per [facility] Records   Auditory hallucinations 08/23/2020   Carpal tunnel syndrome    Per [facility] Records   Difficulty walking    Per [facility] Records   Gait disturbance    Hypertension    Hyponatremia 04/12/2022   Low serum vitamin D    Stage 3b chronic kidney disease (HCC)    Tinnitus    Per [facility] Records   Past Surgical History:  Procedure Laterality Date   APPENDECTOMY     BACK SURGERY     CATARACT EXTRACTION, BILATERAL     CORNEAL TRANSPLANT     TOTAL ABDOMINAL HYSTERECTOMY W/ BILATERAL SALPINGOOPHORECTOMY      TUBAL LIGATION      Allergies[1]  Outpatient Encounter Medications as of 12/31/2024  Medication Sig   alendronate (FOSAMAX) 70 MG tablet Take 70 mg by mouth once a week. Take with a full glass of water on an empty stomach. Every Wednesday   amLODipine  (NORVASC ) 5 MG tablet Take 5 mg by mouth daily.   Cholecalciferol (VITAMIN D3) 50 MCG (2000 UT) TABS Take 1 tablet by mouth daily.   cyanocobalamin (VITAMIN B12) 1000 MCG tablet Take 1,000 mcg by mouth daily.   furosemide (LASIX) 20 MG tablet Take 20 mg by mouth. Every 24 hours as needed for weight gain >3lbs in one day or 5lbs in one week.   furosemide (LASIX) 40 MG tablet Take 40 mg by mouth every other day.   lisinopril  (ZESTRIL ) 40 MG tablet Take 40 mg by mouth daily.   melatonin 5 MG TABS Take 5 mg by mouth at bedtime.   metoprolol succinate (TOPROL-XL) 25 MG 24 hr tablet Take 1 tablet by mouth in the morning and at bedtime.   mirabegron ER (MYRBETRIQ) 25 MG TB24 tablet Take 25 mg by mouth at bedtime.   polyethylene glycol (MIRALAX / GLYCOLAX) 17 g packet Take 17 g by mouth every Monday, Wednesday, and Friday. Hold for loose stools   potassium chloride (KLOR-CON) 20 MEQ packet Take 20 mEq  by mouth every other day.   Sunscreen SPF50 LOTN Apply 1 Application topically every 2 (two) hours as needed (Sunburn Prevention).   No facility-administered encounter medications on file as of 12/31/2024.    Review of Systems  Immunization History  Administered Date(s) Administered   Fluad Quad(high Dose 65+) 09/17/2023   INFLUENZA, HIGH DOSE SEASONAL PF 09/10/2022   Influenza Split 10/18/2015   Influenza-Unspecified 09/24/2016, 09/07/2024   Moderna Covid-19 Fall Seasonal Vaccine 55yrs & older 03/04/2023   Moderna Covid-19 Vaccine Bivalent Booster 85yrs & up 04/12/2021, 08/17/2021   Moderna SARS-COV2 Booster Vaccination 10/10/2020   Moderna Sars-Covid-2 Vaccination 11/30/2019, 01/07/2020   PNEUMOCOCCAL CONJUGATE-20 09/06/2022   Pneumococcal  Conjugate-13 10/30/2016, 12/24/2018   Pneumococcal Polysaccharide-23 11/29/2005   Pneumococcal-Unspecified 11/25/2005   Tdap 10/29/2011, 07/21/2022   Unspecified SARS-COV-2 Vaccination 08/22/2023, 09/17/2024   Zoster Recombinant(Shingrix) 02/25/2018, 06/15/2018   Zoster, Live 02/23/2006   Pertinent  Health Maintenance Due  Topic Date Due   Influenza Vaccine  Completed   Bone Density Scan  Completed      07/22/2022    1:12 AM 06/05/2023    2:44 PM 10/20/2023    9:27 PM 10/11/2024    7:08 PM 12/07/2024   11:04 AM  Fall Risk  Falls in the past year?  0 1 1 1   Was there an injury with Fall?   0  0  0  Fall Risk Category Calculator   2 1 1   (RETIRED) Patient Fall Risk Level Low fall risk       Patient at Risk for Falls Due to   History of fall(s) History of fall(s);Impaired balance/gait History of fall(s);Impaired balance/gait  Fall risk Follow up   Falls evaluation completed Falls evaluation completed Falls evaluation completed     Data saved with a previous flowsheet row definition   Functional Status Survey:    Vitals:   12/31/24 1057  BP: 134/74  Pulse: 67  SpO2: 94%  Weight: 135 lb 12.8 oz (61.6 kg)  Height: 5' 3 (1.6 m)   Body mass index is 24.06 kg/m. Physical Exam  Labs reviewed: Recent Labs    07/29/24 0000 09/06/24 0000 11/11/24 0000  NA 133* 136* 137  K 4.5 4.2 4.2  CL 99 101 99  CO2 27* 31* 29*  BUN 20 27* 25*  CREATININE 0.9 1.1 0.9  CALCIUM 8.6* 8.4* 8.5*   Recent Labs    07/12/24 0000 11/11/24 0000  AST 17 17  ALT 12 10  ALKPHOS 62 56  ALBUMIN 3.3* 3.4*   Recent Labs    04/26/24 0000 05/18/24 0000 07/12/24 0000 07/29/24 0000  WBC 6.4 5.6 7.2 5.7  NEUTROABS 3,878.00  --  4,968.00 3,454.00  HGB 10.7* 11.4* 10.8* 11.2*  HCT 33* 35* 32* 34*  PLT 233 254 274 262   No results found for: TSH No results found for: HGBA1C No results found for: CHOL, HDL, LDLCALC, LDLDIRECT, TRIG, CHOLHDL  Significant Diagnostic Results  in last 30 days:  No results found.  Assessment/Plan There are no diagnoses linked to this encounter.   Family/ staff Communication: ***  Labs/tests ordered:  ***       [1] No Known Allergies  "
